# Patient Record
Sex: Male | Born: 1940 | Race: White | Hispanic: No | Marital: Married | Smoking: Former smoker
Health system: Southern US, Community
[De-identification: ages and names within clinical notes are randomized; demographics above are authoritative.]

## PROBLEM LIST (undated history)

## (undated) DIAGNOSIS — I10 Essential (primary) hypertension: Secondary | ICD-10-CM

## (undated) DIAGNOSIS — S2239XA Fracture of one rib, unspecified side, initial encounter for closed fracture: Secondary | ICD-10-CM

## (undated) HISTORY — DX: Fracture of one rib, unspecified side, initial encounter for closed fracture: S22.39XA

---

## 2010-11-09 HISTORY — PX: CHOLECYSTECTOMY: SHX55

## 2010-12-29 ENCOUNTER — Inpatient Hospital Stay: Payer: Self-pay | Admitting: Surgery

## 2012-01-21 ENCOUNTER — Ambulatory Visit: Payer: Self-pay | Admitting: Gastroenterology

## 2012-01-26 LAB — PATHOLOGY REPORT

## 2012-07-04 DIAGNOSIS — R7309 Other abnormal glucose: Secondary | ICD-10-CM | POA: Insufficient documentation

## 2012-07-04 DIAGNOSIS — I129 Hypertensive chronic kidney disease with stage 1 through stage 4 chronic kidney disease, or unspecified chronic kidney disease: Secondary | ICD-10-CM | POA: Insufficient documentation

## 2014-12-11 DIAGNOSIS — H3532 Exudative age-related macular degeneration: Secondary | ICD-10-CM | POA: Diagnosis not present

## 2014-12-11 DIAGNOSIS — H43813 Vitreous degeneration, bilateral: Secondary | ICD-10-CM | POA: Diagnosis not present

## 2014-12-11 DIAGNOSIS — H35052 Retinal neovascularization, unspecified, left eye: Secondary | ICD-10-CM | POA: Diagnosis not present

## 2014-12-11 DIAGNOSIS — H3531 Nonexudative age-related macular degeneration: Secondary | ICD-10-CM | POA: Diagnosis not present

## 2014-12-17 DIAGNOSIS — H35052 Retinal neovascularization, unspecified, left eye: Secondary | ICD-10-CM | POA: Diagnosis not present

## 2014-12-17 DIAGNOSIS — H3532 Exudative age-related macular degeneration: Secondary | ICD-10-CM | POA: Diagnosis not present

## 2015-01-03 DIAGNOSIS — Z9181 History of falling: Secondary | ICD-10-CM | POA: Diagnosis not present

## 2015-01-03 DIAGNOSIS — I10 Essential (primary) hypertension: Secondary | ICD-10-CM | POA: Diagnosis not present

## 2015-01-03 DIAGNOSIS — Z1389 Encounter for screening for other disorder: Secondary | ICD-10-CM | POA: Diagnosis not present

## 2015-01-21 DIAGNOSIS — H4011X1 Primary open-angle glaucoma, mild stage: Secondary | ICD-10-CM | POA: Diagnosis not present

## 2015-03-04 DIAGNOSIS — H3531 Nonexudative age-related macular degeneration: Secondary | ICD-10-CM | POA: Diagnosis not present

## 2015-03-04 DIAGNOSIS — H43813 Vitreous degeneration, bilateral: Secondary | ICD-10-CM | POA: Diagnosis not present

## 2015-03-04 DIAGNOSIS — H3532 Exudative age-related macular degeneration: Secondary | ICD-10-CM | POA: Diagnosis not present

## 2015-03-04 DIAGNOSIS — H35052 Retinal neovascularization, unspecified, left eye: Secondary | ICD-10-CM | POA: Diagnosis not present

## 2015-03-15 DIAGNOSIS — H35052 Retinal neovascularization, unspecified, left eye: Secondary | ICD-10-CM | POA: Diagnosis not present

## 2015-03-15 DIAGNOSIS — H3532 Exudative age-related macular degeneration: Secondary | ICD-10-CM | POA: Diagnosis not present

## 2015-03-29 DIAGNOSIS — Z008 Encounter for other general examination: Secondary | ICD-10-CM | POA: Diagnosis not present

## 2015-04-29 DIAGNOSIS — H4011X1 Primary open-angle glaucoma, mild stage: Secondary | ICD-10-CM | POA: Diagnosis not present

## 2015-05-24 DIAGNOSIS — H25813 Combined forms of age-related cataract, bilateral: Secondary | ICD-10-CM | POA: Diagnosis not present

## 2015-05-24 DIAGNOSIS — T1512XA Foreign body in conjunctival sac, left eye, initial encounter: Secondary | ICD-10-CM | POA: Diagnosis not present

## 2015-07-11 DIAGNOSIS — S2239XA Fracture of one rib, unspecified side, initial encounter for closed fracture: Secondary | ICD-10-CM

## 2015-07-11 HISTORY — DX: Fracture of one rib, unspecified side, initial encounter for closed fracture: S22.39XA

## 2015-07-17 DIAGNOSIS — H3531 Nonexudative age-related macular degeneration: Secondary | ICD-10-CM | POA: Diagnosis not present

## 2015-07-17 DIAGNOSIS — H3532 Exudative age-related macular degeneration: Secondary | ICD-10-CM | POA: Diagnosis not present

## 2015-07-26 ENCOUNTER — Emergency Department: Payer: Medicare Other

## 2015-07-26 ENCOUNTER — Observation Stay
Admission: EM | Admit: 2015-07-26 | Discharge: 2015-07-27 | Disposition: A | Discharge status: Home / Self Care | Payer: Medicare Other | Attending: Emergency Medicine | Admitting: Emergency Medicine

## 2015-07-26 ENCOUNTER — Encounter: Payer: Self-pay | Admitting: Radiology

## 2015-07-26 DIAGNOSIS — I10 Essential (primary) hypertension: Secondary | ICD-10-CM | POA: Diagnosis not present

## 2015-07-26 DIAGNOSIS — Z87891 Personal history of nicotine dependence: Secondary | ICD-10-CM | POA: Diagnosis not present

## 2015-07-26 DIAGNOSIS — Z8249 Family history of ischemic heart disease and other diseases of the circulatory system: Secondary | ICD-10-CM | POA: Insufficient documentation

## 2015-07-26 DIAGNOSIS — S2242XA Multiple fractures of ribs, left side, initial encounter for closed fracture: Secondary | ICD-10-CM | POA: Insufficient documentation

## 2015-07-26 DIAGNOSIS — S27329A Contusion of lung, unspecified, initial encounter: Secondary | ICD-10-CM | POA: Diagnosis not present

## 2015-07-26 DIAGNOSIS — Z043 Encounter for examination and observation following other accident: Secondary | ICD-10-CM | POA: Diagnosis not present

## 2015-07-26 DIAGNOSIS — S272XXD Traumatic hemopneumothorax, subsequent encounter: Secondary | ICD-10-CM

## 2015-07-26 DIAGNOSIS — I2510 Atherosclerotic heart disease of native coronary artery without angina pectoris: Secondary | ICD-10-CM | POA: Diagnosis not present

## 2015-07-26 DIAGNOSIS — S272XXA Traumatic hemopneumothorax, initial encounter: Secondary | ICD-10-CM | POA: Insufficient documentation

## 2015-07-26 DIAGNOSIS — Y9352 Activity, horseback riding: Secondary | ICD-10-CM | POA: Diagnosis not present

## 2015-07-26 DIAGNOSIS — Z79899 Other long term (current) drug therapy: Secondary | ICD-10-CM | POA: Diagnosis not present

## 2015-07-26 DIAGNOSIS — J9811 Atelectasis: Secondary | ICD-10-CM | POA: Diagnosis not present

## 2015-07-26 DIAGNOSIS — S32018A Other fracture of first lumbar vertebra, initial encounter for closed fracture: Secondary | ICD-10-CM | POA: Diagnosis not present

## 2015-07-26 DIAGNOSIS — S270XXA Traumatic pneumothorax, initial encounter: Secondary | ICD-10-CM | POA: Diagnosis not present

## 2015-07-26 DIAGNOSIS — Z9049 Acquired absence of other specified parts of digestive tract: Secondary | ICD-10-CM | POA: Insufficient documentation

## 2015-07-26 DIAGNOSIS — R911 Solitary pulmonary nodule: Secondary | ICD-10-CM | POA: Insufficient documentation

## 2015-07-26 DIAGNOSIS — R109 Unspecified abdominal pain: Secondary | ICD-10-CM | POA: Insufficient documentation

## 2015-07-26 DIAGNOSIS — S2249XA Multiple fractures of ribs, unspecified side, initial encounter for closed fracture: Secondary | ICD-10-CM | POA: Insufficient documentation

## 2015-07-26 HISTORY — DX: Essential (primary) hypertension: I10

## 2015-07-26 LAB — CBC WITH DIFFERENTIAL/PLATELET
BASOS ABS: 0 10*3/uL (ref 0–0.1)
BASOS PCT: 0 %
Eosinophils Absolute: 0.2 10*3/uL (ref 0–0.7)
Eosinophils Relative: 2 %
HCT: 37.1 % — ABNORMAL LOW (ref 40.0–52.0)
HEMOGLOBIN: 12.7 g/dL — AB (ref 13.0–18.0)
Lymphocytes Relative: 17 %
Lymphs Abs: 1.4 10*3/uL (ref 1.0–3.6)
MCH: 31.7 pg (ref 26.0–34.0)
MCHC: 34.1 g/dL (ref 32.0–36.0)
MCV: 92.9 fL (ref 80.0–100.0)
MONO ABS: 1 10*3/uL (ref 0.2–1.0)
MONOS PCT: 11 %
NEUTROS ABS: 6.1 10*3/uL (ref 1.4–6.5)
NEUTROS PCT: 70 %
Platelets: 209 10*3/uL (ref 150–440)
RBC: 4 MIL/uL — ABNORMAL LOW (ref 4.40–5.90)
RDW: 12.8 % (ref 11.5–14.5)
WBC: 8.7 10*3/uL (ref 3.8–10.6)

## 2015-07-26 LAB — URINALYSIS COMPLETE WITH MICROSCOPIC (ARMC ONLY)
BACTERIA UA: NONE SEEN
Bilirubin Urine: NEGATIVE
Glucose, UA: NEGATIVE mg/dL
Hgb urine dipstick: NEGATIVE
Ketones, ur: NEGATIVE mg/dL
NITRITE: NEGATIVE
PROTEIN: NEGATIVE mg/dL
SPECIFIC GRAVITY, URINE: 1.03 (ref 1.005–1.030)
SQUAMOUS EPITHELIAL / LPF: NONE SEEN
pH: 5 (ref 5.0–8.0)

## 2015-07-26 LAB — APTT: APTT: 30 s (ref 24–36)

## 2015-07-26 LAB — COMPREHENSIVE METABOLIC PANEL
ALBUMIN: 3.9 g/dL (ref 3.5–5.0)
ALT: 29 U/L (ref 17–63)
AST: 47 U/L — ABNORMAL HIGH (ref 15–41)
Alkaline Phosphatase: 62 U/L (ref 38–126)
Anion gap: 7 (ref 5–15)
BUN: 25 mg/dL — ABNORMAL HIGH (ref 6–20)
CALCIUM: 8.9 mg/dL (ref 8.9–10.3)
CO2: 27 mmol/L (ref 22–32)
Chloride: 105 mmol/L (ref 101–111)
Creatinine, Ser: 1.08 mg/dL (ref 0.61–1.24)
GFR calc non Af Amer: >60 mL/min (ref >60)
GLUCOSE: 128 mg/dL — AB (ref 65–99)
POTASSIUM: 3.9 mmol/L (ref 3.5–5.1)
SODIUM: 139 mmol/L (ref 135–145)
Total Bilirubin: 1 mg/dL (ref 0.3–1.2)
Total Protein: 7.2 g/dL (ref 6.5–8.1)

## 2015-07-26 LAB — PROTIME-INR
INR: 1.08
PROTHROMBIN TIME: 14.2 s (ref 11.4–15.0)

## 2015-07-26 MED ORDER — ENOXAPARIN SODIUM 40 MG/0.4ML ~~LOC~~ SOLN: 40.0000 mg | SUBCUTANEOUS | Status: DC

## 2015-07-26 MED ORDER — KETOROLAC TROMETHAMINE 15 MG/ML IJ SOLN
15.0000 mg | Freq: Four times a day (QID) | INTRAMUSCULAR | Status: AC
  Administered 2015-07-26 – 2015-07-27 (×3): 15 mg via INTRAVENOUS
  Filled 2015-07-26 (×6): qty 1

## 2015-07-26 MED ORDER — ONDANSETRON 4 MG PO TBDP: 4.0000 mg | ORAL_TABLET | Freq: Four times a day (QID) | ORAL | Status: DC | PRN

## 2015-07-26 MED ORDER — KETOROLAC TROMETHAMINE 15 MG/ML IJ SOLN
15.0000 mg | Freq: Four times a day (QID) | INTRAMUSCULAR | Status: DC | PRN
  Filled 2015-07-26: qty 1

## 2015-07-26 MED ORDER — IOHEXOL 300 MG/ML  SOLN
100.0000 mL | Freq: Once | INTRAMUSCULAR | Status: AC | PRN
  Administered 2015-07-26: 100 mL via INTRAVENOUS
  Filled 2015-07-26: qty 100

## 2015-07-26 MED ORDER — LACTATED RINGERS IV SOLN
INTRAVENOUS | Status: DC
  Administered 2015-07-26 – 2015-07-27 (×2): via INTRAVENOUS

## 2015-07-26 MED ORDER — DIPHENHYDRAMINE HCL 12.5 MG/5ML PO ELIX: 12.5000 mg | ORAL_SOLUTION | Freq: Four times a day (QID) | ORAL | Status: DC | PRN

## 2015-07-26 MED ORDER — LOSARTAN POTASSIUM 25 MG PO TABS
100.0000 mg | ORAL_TABLET | Freq: Every day | ORAL | Status: DC
  Administered 2015-07-27: 100 mg via ORAL
  Filled 2015-07-26: qty 4

## 2015-07-26 MED ORDER — INFLUENZA VAC SPLIT QUAD 0.5 ML IM SUSY: 0.5000 mL | PREFILLED_SYRINGE | INTRAMUSCULAR | Status: DC

## 2015-07-26 MED ORDER — ONDANSETRON HCL 4 MG/2ML IJ SOLN: 4.0000 mg | Freq: Four times a day (QID) | INTRAMUSCULAR | Status: DC | PRN

## 2015-07-26 MED ORDER — HYDROMORPHONE HCL 1 MG/ML IJ SOLN: 0.5000 mg | INTRAMUSCULAR | Status: DC | PRN

## 2015-07-26 MED ORDER — CYCLOBENZAPRINE HCL 10 MG PO TABS
5.0000 mg | ORAL_TABLET | Freq: Three times a day (TID) | ORAL | Status: DC
  Administered 2015-07-26 – 2015-07-27 (×3): 5 mg via ORAL
  Filled 2015-07-26 (×3): qty 1

## 2015-07-26 MED ORDER — DIPHENHYDRAMINE HCL 50 MG/ML IJ SOLN: 12.5000 mg | Freq: Four times a day (QID) | INTRAMUSCULAR | Status: DC | PRN

## 2015-07-26 MED ORDER — OXYCODONE HCL 5 MG PO TABS: 5.0000 mg | ORAL_TABLET | ORAL | Status: DC | PRN

## 2015-07-26 MED ORDER — ACETAMINOPHEN 500 MG PO TABS
1000.0000 mg | ORAL_TABLET | Freq: Four times a day (QID) | ORAL | Status: DC
  Administered 2015-07-26 – 2015-07-27 (×4): 1000 mg via ORAL
  Filled 2015-07-26 (×4): qty 2

## 2015-07-26 NOTE — ED Notes (Signed)
Pt states he was on his horse last night about 6pm and was thrown onto a post and started having swelling to his left flank in the middle of the night. States no sob, difficulty urinating or blood in the urine. Pt was having difficulty sitting down from a standing position.

## 2015-07-26 NOTE — H&P (Signed)
Jason Craig is an 74 y.o. male.   Chief Complaint: Thrown from horse into fence post HPI: 74 yr old who was thrown from horse and left back hit a fence post.  Patient states it was painful but that his back and chest began really hurting last night.  He states that it felt worse to lay down so he slept sitting up in his recliner. He denies any SOB or chest pain or any pain when he is not moving.  States chest with hurt some with a deep breath and back hurts with movement.  He denies any LOC, headache, neck pain, numbness, tingling, edema in legs, abdominal pain, dysuria or hematuria.     Past Medical History  Diagnosis Date  . Hypertension     Past Surgical History  Procedure Laterality Date  . Cholecystectomy  2012    Dr. Burt Knack    Family History  Problem Relation Age of Onset  . Heart disease Mother   . Heart disease Father    Social History:  reports that he quit smoking about 31 years ago. His smoking use included Cigarettes. He has never used smokeless tobacco. He reports that he does not drink alcohol or use illicit drugs.  Allergies: No Known Allergies   (Not in a hospital admission)  Results for orders placed or performed during the hospital encounter of 07/26/15 (from the past 48 hour(s))  Comprehensive metabolic panel     Status: Abnormal   Collection Time: 07/26/15  1:53 PM  Result Value Ref Range   Sodium 139 135 - 145 mmol/L   Potassium 3.9 3.5 - 5.1 mmol/L   Chloride 105 101 - 111 mmol/L   CO2 27 22 - 32 mmol/L   Glucose, Bld 128 (H) 65 - 99 mg/dL   BUN 25 (H) 6 - 20 mg/dL   Creatinine, Ser 1.08 0.61 - 1.24 mg/dL   Calcium 8.9 8.9 - 10.3 mg/dL   Total Protein 7.2 6.5 - 8.1 g/dL   Albumin 3.9 3.5 - 5.0 g/dL   AST 47 (H) 15 - 41 U/L   ALT 29 17 - 63 U/L   Alkaline Phosphatase 62 38 - 126 U/L   Total Bilirubin 1.0 0.3 - 1.2 mg/dL   GFR calc non Af Amer >60 >60 mL/min   GFR calc Af Amer >60 >60 mL/min    Comment: (NOTE) The eGFR has been calculated using the  CKD EPI equation. This calculation has not been validated in all clinical situations. eGFR's persistently <60 mL/min signify possible Chronic Kidney Disease.    Anion gap 7 5 - 15  CBC with Differential     Status: Abnormal   Collection Time: 07/26/15  1:53 PM  Result Value Ref Range   WBC 8.7 3.8 - 10.6 K/uL   RBC 4.00 (L) 4.40 - 5.90 MIL/uL   Hemoglobin 12.7 (L) 13.0 - 18.0 g/dL   HCT 37.1 (L) 40.0 - 52.0 %   MCV 92.9 80.0 - 100.0 fL   MCH 31.7 26.0 - 34.0 pg   MCHC 34.1 32.0 - 36.0 g/dL   RDW 12.8 11.5 - 14.5 %   Platelets 209 150 - 440 K/uL   Neutrophils Relative % 70 %   Neutro Abs 6.1 1.4 - 6.5 K/uL   Lymphocytes Relative 17 %   Lymphs Abs 1.4 1.0 - 3.6 K/uL   Monocytes Relative 11 %   Monocytes Absolute 1.0 0.2 - 1.0 K/uL   Eosinophils Relative 2 %   Eosinophils  Absolute 0.2 0 - 0.7 K/uL   Basophils Relative 0 %   Basophils Absolute 0.0 0 - 0.1 K/uL  APTT     Status: None   Collection Time: 07/26/15  1:53 PM  Result Value Ref Range   aPTT 30 24 - 36 seconds  Protime-INR     Status: None   Collection Time: 07/26/15  1:53 PM  Result Value Ref Range   Prothrombin Time 14.2 11.4 - 15.0 seconds   INR 1.08   Urinalysis complete, with microscopic     Status: Abnormal   Collection Time: 07/26/15  2:21 PM  Result Value Ref Range   Color, Urine AMBER (A) YELLOW   APPearance CLEAR (A) CLEAR   Glucose, UA NEGATIVE NEGATIVE mg/dL   Bilirubin Urine NEGATIVE NEGATIVE   Ketones, ur NEGATIVE NEGATIVE mg/dL   Specific Gravity, Urine 1.030 1.005 - 1.030   Hgb urine dipstick NEGATIVE NEGATIVE   pH 5.0 5.0 - 8.0   Protein, ur NEGATIVE NEGATIVE mg/dL   Nitrite NEGATIVE NEGATIVE   Leukocytes, UA TRACE (A) NEGATIVE   RBC / HPF 0-5 0 - 5 RBC/hpf   WBC, UA 0-5 0 - 5 WBC/hpf   Bacteria, UA NONE SEEN NONE SEEN   Squamous Epithelial / LPF NONE SEEN NONE SEEN   Mucous PRESENT    Ct Head Wo Contrast  07/26/2015   CLINICAL DATA:  Patient thrown office worse last night  EXAM: CT HEAD  WITHOUT CONTRAST  CT CERVICAL SPINE WITHOUT CONTRAST  TECHNIQUE: Multidetector CT imaging of the head and cervical spine was performed following the standard protocol without intravenous contrast. Multiplanar CT image reconstructions of the cervical spine were also generated.  COMPARISON:  None.  FINDINGS: CT HEAD FINDINGS  Normal attenuation allowing for intravenous contrast administration, which could potentially disguise small foci of hemorrhage. No extra-axial hemorrhage or parenchymal hematoma. No evidence of infarct or mass. Calvarium is intact. Moderate chronic appearing inflammatory change in the visualized portions of the maxillary and ethmoid air cells.  CT CERVICAL SPINE FINDINGS  Normal alignment. No prevertebral soft tissue swelling. No fracture. Large multilevel anterior osteophytes.  Small pneumothorax noted on the left as described on report of CT thorax performed earlier today.  IMPRESSION: No acute abnormality cervical spine. No acute intracranial abnormalities allowing for recent intravenous contrast administration.   Electronically Signed   By: Skipper Cliche M.D.   On: 07/26/2015 16:58   Ct Chest W Contrast  07/26/2015   CLINICAL DATA:  Thrown from a horse today.  Left flank pain.  EXAM: CT CHEST, ABDOMEN, AND PELVIS WITH CONTRAST  TECHNIQUE: Multidetector CT imaging of the chest, abdomen and pelvis was performed following the standard protocol during bolus administration of intravenous contrast.  CONTRAST:  143m OMNIPAQUE IOHEXOL 300 MG/ML  SOLN  COMPARISON:  None.  FINDINGS: CT CHEST FINDINGS  Chest wall: No chest wall mass or hematoma. No supraclavicular or axillary adenopathy. The thyroid gland appears normal.  There are multiple left lower rib fractures. The eighth, ninth, tenth, eleventh and twelfth left ribs are fractured. The tenth rib fracture is displaced. The tenth, eleventh and twelfth ribs are fractured in 2 places, near the spine and more posterior laterally. There are also  fractures of the first and second left transverse processes. The thoracic and lumbar vertebral bodies are normally aligned. No acute compression fracture. Advanced facet disease noted in the thoracolumbar spine.  Mediastinum: The heart is upper limits of normal in size. No pericardial effusion. No mediastinal hematoma.  No sternal fracture or substernal hematoma. The aorta is normal in caliber. No dissection. Coronary artery calcifications are noted. The esophagus is grossly normal.  Lungs/ pleura: There is a the small left-sided pneumothorax estimated at 5-10%. There is also subcutaneous emphysema involving the left lower lateral posterior chest wall. There is left lower lobe atelectasis or pulmonary contusion along with a small left pleural effusion.  CT ABDOMEN AND PELVIS FINDINGS  Hepatobiliary: No focal hepatic lesions or intrahepatic biliary dilatation. A few small calcified granulomas are noted. No acute hepatic injury. No perihepatic fluid collections. The gallbladder is surgically absent. No common bile duct dilatation.  Pancreas: No mass, inflammation or acute injury. No peripancreatic fluid collections.  Spleen: Normal size. No focal lesions. No acute injury. No periapical splenic fluid collections.  Adrenals/Urinary Tract: The adrenal glands are normal. Both kidneys are normal. No acute injury.  Stomach/Bowel: The stomach, duodenum, small bowel and colon are grossly normal without oral contrast. The terminal ileum is normal. The appendix is normal.  Vascular/Lymphatic: No mesenteric or retroperitoneal mass, adenopathy or hematoma. The aorta and branch vessels are patent. The major venous structures are patent.  Other: The bladder, prostate gland and seminal vesicles are normal. The rectum and sigmoid colon are normal. No pelvic mass or adenopathy. No hematoma. No inguinal mass or adenopathy.  Musculoskeletal: No significant bony findings involving the lumbar spine or pelvis. Moderate hip joint  degenerative changes bilaterally. The pubic symphysis and SI joints are intact degenerative changes are noted and partial fusion of the SI joints are noted.  IMPRESSION: 1. 8-12 left posterior rib fractures as discussed above. The tenth rib fracture is displaced. 2. Small, 5-10 percent, left-sided pneumothorax. 3. Left lower lobe atelectasis and/or contusion with small pleural effusion. 4. No acute intra-abdominal/pelvic injuries are identified. 5. Nondisplaced transverse process fractures of L1 and L2.   Electronically Signed   By: Marijo Sanes M.D.   On: 07/26/2015 15:57   Ct Cervical Spine Wo Contrast  07/26/2015   CLINICAL DATA:  Patient thrown office worse last night  EXAM: CT HEAD WITHOUT CONTRAST  CT CERVICAL SPINE WITHOUT CONTRAST  TECHNIQUE: Multidetector CT imaging of the head and cervical spine was performed following the standard protocol without intravenous contrast. Multiplanar CT image reconstructions of the cervical spine were also generated.  COMPARISON:  None.  FINDINGS: CT HEAD FINDINGS  Normal attenuation allowing for intravenous contrast administration, which could potentially disguise small foci of hemorrhage. No extra-axial hemorrhage or parenchymal hematoma. No evidence of infarct or mass. Calvarium is intact. Moderate chronic appearing inflammatory change in the visualized portions of the maxillary and ethmoid air cells.  CT CERVICAL SPINE FINDINGS  Normal alignment. No prevertebral soft tissue swelling. No fracture. Large multilevel anterior osteophytes.  Small pneumothorax noted on the left as described on report of CT thorax performed earlier today.  IMPRESSION: No acute abnormality cervical spine. No acute intracranial abnormalities allowing for recent intravenous contrast administration.   Electronically Signed   By: Skipper Cliche M.D.   On: 07/26/2015 16:58   Ct Abdomen Pelvis W Contrast  07/26/2015   CLINICAL DATA:  Thrown from a horse today.  Left flank pain.  EXAM: CT  CHEST, ABDOMEN, AND PELVIS WITH CONTRAST  TECHNIQUE: Multidetector CT imaging of the chest, abdomen and pelvis was performed following the standard protocol during bolus administration of intravenous contrast.  CONTRAST:  184m OMNIPAQUE IOHEXOL 300 MG/ML  SOLN  COMPARISON:  None.  FINDINGS: CT CHEST FINDINGS  Chest wall: No chest  wall mass or hematoma. No supraclavicular or axillary adenopathy. The thyroid gland appears normal.  There are multiple left lower rib fractures. The eighth, ninth, tenth, eleventh and twelfth left ribs are fractured. The tenth rib fracture is displaced. The tenth, eleventh and twelfth ribs are fractured in 2 places, near the spine and more posterior laterally. There are also fractures of the first and second left transverse processes. The thoracic and lumbar vertebral bodies are normally aligned. No acute compression fracture. Advanced facet disease noted in the thoracolumbar spine.  Mediastinum: The heart is upper limits of normal in size. No pericardial effusion. No mediastinal hematoma. No sternal fracture or substernal hematoma. The aorta is normal in caliber. No dissection. Coronary artery calcifications are noted. The esophagus is grossly normal.  Lungs/ pleura: There is a the small left-sided pneumothorax estimated at 5-10%. There is also subcutaneous emphysema involving the left lower lateral posterior chest wall. There is left lower lobe atelectasis or pulmonary contusion along with a small left pleural effusion.  CT ABDOMEN AND PELVIS FINDINGS  Hepatobiliary: No focal hepatic lesions or intrahepatic biliary dilatation. A few small calcified granulomas are noted. No acute hepatic injury. No perihepatic fluid collections. The gallbladder is surgically absent. No common bile duct dilatation.  Pancreas: No mass, inflammation or acute injury. No peripancreatic fluid collections.  Spleen: Normal size. No focal lesions. No acute injury. No periapical splenic fluid collections.   Adrenals/Urinary Tract: The adrenal glands are normal. Both kidneys are normal. No acute injury.  Stomach/Bowel: The stomach, duodenum, small bowel and colon are grossly normal without oral contrast. The terminal ileum is normal. The appendix is normal.  Vascular/Lymphatic: No mesenteric or retroperitoneal mass, adenopathy or hematoma. The aorta and branch vessels are patent. The major venous structures are patent.  Other: The bladder, prostate gland and seminal vesicles are normal. The rectum and sigmoid colon are normal. No pelvic mass or adenopathy. No hematoma. No inguinal mass or adenopathy.  Musculoskeletal: No significant bony findings involving the lumbar spine or pelvis. Moderate hip joint degenerative changes bilaterally. The pubic symphysis and SI joints are intact degenerative changes are noted and partial fusion of the SI joints are noted.  IMPRESSION: 1. 8-12 left posterior rib fractures as discussed above. The tenth rib fracture is displaced. 2. Small, 5-10 percent, left-sided pneumothorax. 3. Left lower lobe atelectasis and/or contusion with small pleural effusion. 4. No acute intra-abdominal/pelvic injuries are identified. 5. Nondisplaced transverse process fractures of L1 and L2.   Electronically Signed   By: Marijo Sanes M.D.   On: 07/26/2015 15:57    Review of Systems  Constitutional: Negative for fever, chills, weight loss and malaise/fatigue.  HENT: Negative for ear pain and sore throat.   Eyes: Negative for blurred vision and double vision.  Respiratory: Negative for cough, shortness of breath and wheezing.   Cardiovascular: Positive for chest pain. Negative for palpitations and leg swelling.  Gastrointestinal: Negative for nausea, vomiting, abdominal pain, diarrhea and constipation.  Genitourinary: Positive for flank pain. Negative for dysuria and hematuria.  Musculoskeletal: Positive for back pain. Negative for joint pain and neck pain.  Skin: Negative for itching and rash.   Neurological: Negative for dizziness, tremors, focal weakness, loss of consciousness, weakness and headaches.  Psychiatric/Behavioral: Negative for memory loss. The patient is not nervous/anxious.   All other systems reviewed and are negative.   Blood pressure 137/80, pulse 67, temperature 98 F (36.7 C), temperature source Oral, resp. rate 22, height '5\' 11"'  (1.803 m), weight 195 lb (  88.451 kg), SpO2 97 %. Physical Exam  Vitals reviewed. Constitutional: He is oriented to person, place, and time. He appears well-developed and well-nourished. No distress.  HENT:  Head: Normocephalic and atraumatic.  Right Ear: External ear normal.  Left Ear: External ear normal.  Nose: Nose normal.  Mouth/Throat: Oropharynx is clear and moist. No oropharyngeal exudate.  Eyes: Conjunctivae and EOM are normal. Pupils are equal, round, and reactive to light. Right eye exhibits no discharge. Left eye exhibits no discharge. No scleral icterus.  Neck: Normal range of motion. Neck supple. No tracheal deviation present. No thyromegaly present.  No pain along cervical spine, no pain in spine with ROM, musculoskeletal pain along lateral border of right SCM with palpation, no bruising, erythema or edema  Cardiovascular: Normal rate, regular rhythm, normal heart sounds and intact distal pulses.  Exam reveals no gallop and no friction rub.   No murmur heard. Respiratory: Effort normal. No respiratory distress. He has no wheezes. He has no rales. He exhibits tenderness.  Crackles in base of left lung, pain along posterior element of ribs 8-12, no pain in anterior portion  GI: Soft. Bowel sounds are normal. He exhibits no distension. There is no tenderness. There is no guarding.  Musculoskeletal: He exhibits tenderness. He exhibits no edema.  Cervical, Thoracic and Lumbar spine: no pain, tenderness stepoff or deformities, erythema, edema to left side of L1-3 No CVA tenderness bilaterally Left arm: bruising and abrasions  along left forearm but normal ROM in sholder, elbow, wrist and hand 5+ strength, no neurological defects, no pain or tenderness along joints.  All other extremities normal ROM 5+ strength, no numbness  Lymphadenopathy:    He has no cervical adenopathy.  Neurological: He is alert and oriented to person, place, and time. No cranial nerve deficit.  Skin: Skin is warm and dry. No rash noted. There is erythema.  Psychiatric: He has a normal mood and affect. His behavior is normal. Judgment and thought content normal.     Assessment/Plan 74 yr old s/p fall from horse.  I personally reviewed his past medical history and outside records of hypertension and one blood pressure medication.  I personally reviewed the images from CT scans and xrays and noted Multiple concurrent closed posterior rib fractures on the left, small left pneumothorax with small amount of hemothorax and atelectatics; Small stranding around the left kidney in the retroperitoneum, but no injury to the kidney itself, no evidence of injury to the liver, spleen, pancreas, but CT head/neck negative for any traumatic injury.  I also reviewed the radiology reports to confirm injuries seen on CT scan.  I personally reviewed laboratory data including the CBC, CMP and UA, where there is no evidence of injury to kidney or blood in urine.    Will admit to floor for aggressive pulmonary toilet, ambulation, and pain control with IV dilaudid and toradol as well as po pain medication and flexeril for muscle spasms.  Will place on 2L [place] supplemental oxygen to help re-expand lung, but given <5% pneumothorax and no acute respiratory distress, will not place chest tube at this time.  Will continue to evaluate for changes and patient/wife aware that if this changes he may need a chest tube.     Lavona Mound Loflin 07/26/2015, 5:07 PM

## 2015-07-26 NOTE — ED Notes (Signed)
Pt reports being thrown off a horse yesterday evening. His back hit a large wooden fence post. Complaining of left sided flank pain and swelling. 5/10. No LOC. No N/V/D noted.

## 2015-07-26 NOTE — ED Provider Notes (Signed)
-----------------------------------------   3:56 PM on 07/26/2015 -----------------------------------------  Patient care assumed from the physician assistant. I have seen and evaluated the patient myself. The patient has significant left lateral rib tenderness, with crepitus, and palpable rib deformity. Per the patient he was riding a horse around 6:30 PM last night when he fell off landing on a wooden fence post. Patient denies any shortness of breath but does state pain with deep inspiration. Patient was seen and examined by the physician assistant, and sent to the main side of the emergency department given findings on CT scan of a 10% pneumothorax, multiple rib fractures, and L1/L2 transverse process fractures. Patient does have mild to moderate lumbar tenderness to palpation. No deformities palpated. Overall the patient appears quite well. Does not wish for any pain medication at this time. We will obtain a CT head and cervical spine. The patient does have mild right paraspinal tenderness palpation of the neck, and given the distracting injury we will obtain imaging to rule out fracture. Patient denies any headache. Denies any loss of consciousness. Denies any vomiting. Patient's only medical history is blood pressure which he takes losartan. No blood thinners. No known allergies.  Discussed with Dr. Pat Patrick, he will see the patient to decide upon admission versus transfer. Patient appears very stable especially as the injury occurred 21 hours ago. Dr. Pat Patrick has admitted the patient. CT head and neck are negative.  Harvest Dark, MD 07/26/15 [phone]

## 2015-07-26 NOTE — ED Provider Notes (Signed)
[facility] Emergency Department Provider Note  ____________________________________________  Time seen: Approximately 1:54 PM  I have reviewed the triage vital signs and the nursing notes.   HISTORY  Chief Complaint Fall    HPI Jason Craig is a 74 y.o. male who presents to the emergency room after suffering a fall from horse last night. He states that he was thrown from his horse and struck a post in his flank area. He is having significant pain in the flank region and is now experiencing swelling from the flank into the right inguinal area. He states there is no dysuria, difficulty urinating, blood in his urine, change in frequency or melena of urine. He denies any shortness of breath or chest pain. He denies striking his head or neck. He denies any headache or neck pain. The pain is described as moderate to severe. The pain is described as sharp and pressure.   Past Medical History  Diagnosis Date  . Hypertension     Patient Active Problem List   Diagnosis Date Noted  . Rib fractures 07/26/2015  . Pneumothorax with hemothorax, traumatic   . Fracture of multiple ribs   . BP (high blood pressure) 07/04/2012  . Blood glucose elevated 07/04/2012    Past Surgical History  Procedure Laterality Date  . Cholecystectomy  2012    Dr. Burt Knack    Current Outpatient Rx  Name  Route  Sig  Dispense  Refill  . Ascorbic Acid (VITAMIN C PO)   Oral   Take 1 tablet by mouth daily.         . Calcium Citrate-Vitamin D (CALCIUM + D PO)   Oral   Take 1 tablet by mouth daily.         . Coenzyme Q10 (COQ10 PO)   Oral   Take 1 capsule by mouth daily.         Marland Kitchen losartan (COZAAR) 100 MG tablet   Oral   Take 100 mg by mouth daily.         . Multiple Vitamin (MULTIVITAMIN WITH MINERALS) TABS tablet   Oral   Take 1 tablet by mouth daily.         . Omega-3 Fatty Acids (FISH OIL PO)   Oral   Take 1 capsule by mouth daily.         Marland Kitchen oxyCODONE (OXY  IR/ROXICODONE) 5 MG immediate release tablet   Oral   Take 1-2 tablets (5-10 mg total) by mouth every 4 (four) hours as needed for moderate pain.   30 tablet   0     Allergies Review of patient's allergies indicates no known allergies.  Family History  Problem Relation Age of Onset  . Heart disease Mother   . Heart disease Father     Social History Social History  Substance Use Topics  . Smoking status: Former Smoker    Types: Cigarettes    Quit date: 11/25/1983  . Smokeless tobacco: Never Used  . Alcohol Use: No    Review of Systems Constitutional: No fever/chills Eyes: No visual changes. ENT: No sore throat. Cardiovascular: Denies chest pain. Respiratory: Denies shortness of breath. Gastrointestinal: Positive for left-sided abdominal pain.  No nausea, no vomiting.  No diarrhea.  No constipation. Genitourinary: Negative for dysuria. Negative for blood. Negative for any type of urinary change. Musculoskeletal: Positive for back pain. Skin: Negative for rash. Neurological: Negative for headaches, focal weakness or numbness.  10-point ROS otherwise negative.  ____________________________________________  PHYSICAL EXAM:  VITAL SIGNS: ED Triage Vitals  Enc Vitals Group     BP 07/26/15 1319 140/68 mmHg     Pulse Rate 07/26/15 1319 87     Resp 07/26/15 1319 18     Temp 07/26/15 1319 98 F (36.7 C)     Temp Source 07/26/15 1319 Oral     SpO2 07/26/15 1319 96 %     Weight 07/26/15 1319 195 lb (88.451 kg)     Height 07/26/15 1319 5\' 11"  (1.803 m)     Head Cir --      Peak Flow --      Pain Score 07/26/15 1328 5     Pain Loc --      Pain Edu? --      Excl. in [place]? --     Constitutional: Alert and oriented. Well appearing and in no acute distress. Eyes: Conjunctivae are normal. PERRL. EOMI. Head: Atraumatic. Nose: No congestion/rhinnorhea. Mouth/Throat: Mucous membranes are moist.  Oropharynx non-erythematous. Neck: No stridor.  No cervical spine tenderness  to palpation. Cardiovascular: Normal rate, regular rhythm. Grossly normal heart sounds.  Good peripheral circulation. Respiratory: Normal respiratory effort.  No retractions. Lungs CTAB. breath sounds Gastrointestinal: Soft and nontender. Mild SI distention. No abdominal bruits. Positive CVA tenderness to left side negative CVA tenderness on right side. Musculoskeletal: No lower extremity tenderness nor edema.  No joint effusions. Diffuse tenderness to left ribs, left flank, and left abdomen. No point tenderness along spine. No flail chest segments noted.  Neurologic:  Normal speech and language. No gross focal neurologic deficits are appreciated. No gait instability. Skin:  Skin is warm, dry and intact. No rash noted. Psychiatric: Mood and affect are normal. Speech and behavior are normal.  ____________________________________________   LABS (all labs ordered are listed, but only abnormal results are displayed)  Labs Reviewed  COMPREHENSIVE METABOLIC PANEL - Abnormal; Notable for the following:    Glucose, Bld 128 (*)    BUN 25 (*)    AST 47 (*)    All other components within normal limits  CBC WITH DIFFERENTIAL/PLATELET - Abnormal; Notable for the following:    RBC 4.00 (*)    Hemoglobin 12.7 (*)    HCT 37.1 (*)    All other components within normal limits  URINALYSIS COMPLETEWITH MICROSCOPIC ([facility] ONLY) - Abnormal; Notable for the following:    Color, Urine AMBER (*)    APPearance CLEAR (*)    Leukocytes, UA TRACE (*)    All other components within normal limits  BASIC METABOLIC PANEL - Abnormal; Notable for the following:    Glucose, Bld 108 (*)    BUN 23 (*)    Calcium 8.4 (*)    All other components within normal limits  APTT  PROTIME-INR     Labs reviewed. Urinalysis came back negative for any signs of blood. Incidental finding of trace leuks. CMP returns relatively normal. CBC abnormal for  anemia. ____________________________________________  EKG  None ____________________________________________  RADIOLOGY  Took report from radiologist over the phone. Radiologist reports 8 through 12 left-sided rib fractures. The 10th rib is displaced. Small pneumothorax of 10% on the side. L1 and L2 fracture. Small amounts of subcutaneous air. Report to follow. ____________________________________________   PROCEDURES  Procedure(s) performed: None    ____________________________________________   INITIAL IMPRESSION / ASSESSMENT AND PLAN / ED COURSE  Pertinent labs & imaging results that were available during my care of the patient were reviewed by me and considered in my medical  decision making (see chart for details).  Patient is a 74 year old male came in status post a fall off a horse striking a fence post. Laboratory results were positive for mild anemia and some other incidental findings as dictated above. Extensive radiologic report. Discussed findings with Dr. Silverio Lay and Dr. Burlene Arnt. He is being transferred to room on A pod. ____________________________________________   FINAL CLINICAL IMPRESSION(S) / ED DIAGNOSES  Final diagnoses:  Pneumothorax with hemothorax, traumatic, subsequent encounter      Darletta Moll, PA-C 08/20/15 [place], MD 08/21/15 [phone]

## 2015-07-27 ENCOUNTER — Observation Stay: Payer: Medicare Other

## 2015-07-27 DIAGNOSIS — S271XXA Traumatic hemothorax, initial encounter: Secondary | ICD-10-CM | POA: Diagnosis not present

## 2015-07-27 DIAGNOSIS — S2232XD Fracture of one rib, left side, subsequent encounter for fracture with routine healing: Secondary | ICD-10-CM | POA: Diagnosis not present

## 2015-07-27 LAB — BASIC METABOLIC PANEL
Anion gap: 6 (ref 5–15)
BUN: 23 mg/dL — AB (ref 6–20)
CHLORIDE: 105 mmol/L (ref 101–111)
CO2: 28 mmol/L (ref 22–32)
Calcium: 8.4 mg/dL — ABNORMAL LOW (ref 8.9–10.3)
Creatinine, Ser: 1.14 mg/dL (ref 0.61–1.24)
GFR calc Af Amer: >60 mL/min (ref >60)
GLUCOSE: 108 mg/dL — AB (ref 65–99)
POTASSIUM: 3.8 mmol/L (ref 3.5–5.1)
Sodium: 139 mmol/L (ref 135–145)

## 2015-07-27 MED ORDER — OXYCODONE HCL 5 MG PO TABS: 5.0000 mg | ORAL_TABLET | ORAL | Status: DC | PRN

## 2015-07-27 NOTE — Discharge Summary (Signed)
Patient ID: Jason Craig MRN: [id number] DOB/AGE: 1941-08-18 74 y.o.  Admit date: 07/26/2015 Discharge date: 07/27/2015  Discharge Diagnoses:  Multiple close rib fractures left, pneumothorax left, multiple transverse process fractures  Procedures Performed: None  Discharged Condition: fair  Hospital Course: He was admitted to the emergency room after a fall 24 hours earlier from a horse. Workup with multiple CT scans demonstrated lateral rib fractures 8 through 12 with a very tiny pneumothorax and small pleural effusion. He did not have any significant GI findings. He does not have any abdominal symptoms. He also had several transverse process fractures and his spine. He was admitted the hospital placed on pain medication and IV rehydration observed over the course of the evening. He did not develop any further problems. Chest x-ray this morning reveal slightly more fluid in the base of the left chest but no further pneumothorax. He is discharged home this evening and followup in the office as necessary. Caution regarding activity bathing and driving or given the patient. Seen back in the office as necessary.  Discharge Orders: Discharge Instructions    Diet - low sodium heart healthy    Complete by:  As directed      Increase activity slowly    Complete by:  As directed            Disposition:   Discharge Medications:  Current facility-administered medications:  .  acetaminophen (TYLENOL) tablet 1,000 mg, 1,000 mg, Oral, 4 times per day, Hubbard Robinson, MD, 1,000 mg at 07/27/15 1314 .  cyclobenzaprine (FLEXERIL) tablet 5 mg, 5 mg, Oral, TID, Hubbard Robinson, MD, 5 mg at 07/27/15 0947 .  diphenhydrAMINE (BENADRYL) 12.5 MG/5ML elixir 12.5 mg, 12.5 mg, Oral, Q6H PRN **OR** diphenhydrAMINE (BENADRYL) injection 12.5 mg, 12.5 mg, Intravenous, Q6H PRN, Hubbard Robinson, MD .  HYDROmorphone (DILAUDID) injection 0.5 mg, 0.5 mg, Intravenous, Q3H PRN, Hubbard Robinson, MD .   Influenza vac split quadrivalent PF (FLUARIX) injection 0.5 mL, 0.5 mL, Intramuscular, Tomorrow-1000, Hubbard Robinson, MD, 0.5 mL at 07/27/15 1000 .  [COMPLETED] ketorolac (TORADOL) 15 MG/ML injection 15 mg, 15 mg, Intravenous, 4 times per day, 15 mg at 07/27/15 0541 **FOLLOWED BY** ketorolac (TORADOL) 15 MG/ML injection 15 mg, 15 mg, Intravenous, Q6H PRN, Hubbard Robinson, MD .  lactated ringers infusion, , Intravenous, Continuous, Hubbard Robinson, MD, Last Rate: 75 mL/hr at 07/27/15 0541 .  losartan (COZAAR) tablet 100 mg, 100 mg, Oral, Daily, Hubbard Robinson, MD, 100 mg at 07/27/15 0947 .  ondansetron (ZOFRAN-ODT) disintegrating tablet 4 mg, 4 mg, Oral, Q6H PRN **OR** ondansetron (ZOFRAN) injection 4 mg, 4 mg, Intravenous, Q6H PRN, Hubbard Robinson, MD .  oxyCODONE (Oxy IR/ROXICODONE) immediate release tablet 5-10 mg, 5-10 mg, Oral, Q4H PRN, Hubbard Robinson, MD  Follwup: Follow-up Information    Follow up with [facility].   Why:  As needed   Contact information:   [place] [id number] [phone]      Signed: Dia Crawford III 07/27/2015, 4:27 PM

## 2015-07-27 NOTE — Progress Notes (Signed)
Pt A and O x 4. VSS. Pt tolerating diet well. No complaints of pain or nausea. IV removed intact, prescriptions given. Pt voiced understanding of discharge instructions with no further questions. Pt discharged via wheelchair with nurse.   

## 2015-07-27 NOTE — Progress Notes (Signed)
Subjective:  he is feeling fairly well this morning. His pain is under reasonable control. He is breathing easily and has no GI complaints.  Vital signs in last 24 hours: Temp:  [97.7 F (36.5 C)-98.2 F (36.8 C)] 98 F (36.7 C) (09/17 0459) Pulse Rate:  [63-87] 66 (09/17 0459) Resp:  [17-26] 17 (09/17 0459) BP: (124-140)/(60-80) 124/60 mmHg (09/17 0459) SpO2:  [95 %-100 %] 96 % (09/17 0459) Weight:  [88.451 kg (195 lb)] 88.451 kg (195 lb) (09/16 1319) Last BM Date: 07/25/15  Intake/Output from previous day: 09/16 0701 - 09/17 0700 In: 643 [I.V.:643] Out: 400 [Urine:400]  GI: his abdomen is soft nontender with no organomegaly no rebound or guarding. His lungs are clear. He does not appear to have any significant lateral chest wall tenderness.  Lab Results:  CBC  Recent Labs  07/26/15 1353  WBC 8.7  HGB 12.7*  HCT 37.1*  PLT 209   CMP     Component Value Date/Time   NA 139 07/27/2015 0536   K 3.8 07/27/2015 0536   CL 105 07/27/2015 0536   CO2 28 07/27/2015 0536   GLUCOSE 108* 07/27/2015 0536   BUN 23* 07/27/2015 0536   CREATININE 1.14 07/27/2015 0536   CALCIUM 8.4* 07/27/2015 0536   PROT 7.2 07/26/2015 1353   ALBUMIN 3.9 07/26/2015 1353   AST 47* 07/26/2015 1353   ALT 29 07/26/2015 1353   ALKPHOS 62 07/26/2015 1353   BILITOT 1.0 07/26/2015 1353   GFRNONAA >60 07/27/2015 0536   GFRAA >60 07/27/2015 0536   PT/INR  Recent Labs  07/26/15 1353  LABPROT 14.2  INR 1.08    Studies/Results: Ct Head Wo Contrast  07/26/2015   CLINICAL DATA:  Patient thrown office worse last night  EXAM: CT HEAD WITHOUT CONTRAST  CT CERVICAL SPINE WITHOUT CONTRAST  TECHNIQUE: Multidetector CT imaging of the head and cervical spine was performed following the standard protocol without intravenous contrast. Multiplanar CT image reconstructions of the cervical spine were also generated.  COMPARISON:  None.  FINDINGS: CT HEAD FINDINGS  Normal attenuation allowing for intravenous  contrast administration, which could potentially disguise small foci of hemorrhage. No extra-axial hemorrhage or parenchymal hematoma. No evidence of infarct or mass. Calvarium is intact. Moderate chronic appearing inflammatory change in the visualized portions of the maxillary and ethmoid air cells.  CT CERVICAL SPINE FINDINGS  Normal alignment. No prevertebral soft tissue swelling. No fracture. Large multilevel anterior osteophytes.  Small pneumothorax noted on the left as described on report of CT thorax performed earlier today.  IMPRESSION: No acute abnormality cervical spine. No acute intracranial abnormalities allowing for recent intravenous contrast administration.   Electronically Signed   By: Skipper Cliche M.D.   On: 07/26/2015 16:58   Ct Chest W Contrast  07/26/2015   ADDENDUM REPORT: 07/26/2015 17:18 ADDENDUM: I marked and measured a right middle lobe lung nodule but failed to mention in the report. There is a 5 mm right middle lobe subpleural pulmonary nodule on image number 43. If the patient is at high risk for bronchogenic carcinoma, follow-up chest CT at 6-12 months is recommended. If the patient is at low risk for bronchogenic carcinoma, follow-up chest CT at 12 months is recommended. This recommendation follows the consensus statement: Guidelines for Management of Small Pulmonary Nodules Detected on CT Scans: A Statement from the [place] as published in Radiology 2005;237:395-400. Electronically Signed   By: Marijo Sanes M.D.   On: 07/26/2015 17:18  07/26/2015  CLINICAL DATA:  Thrown from a horse today.  Left flank pain.  EXAM: CT CHEST, ABDOMEN, AND PELVIS WITH CONTRAST  TECHNIQUE: Multidetector CT imaging of the chest, abdomen and pelvis was performed following the standard protocol during bolus administration of intravenous contrast.  CONTRAST:  136mL OMNIPAQUE IOHEXOL 300 MG/ML  SOLN  COMPARISON:  None.  FINDINGS: CT CHEST FINDINGS  Chest wall: No chest wall mass or hematoma.  No supraclavicular or axillary adenopathy. The thyroid gland appears normal.  There are multiple left lower rib fractures. The eighth, ninth, tenth, eleventh and twelfth left ribs are fractured. The tenth rib fracture is displaced. The tenth, eleventh and twelfth ribs are fractured in 2 places, near the spine and more posterior laterally. There are also fractures of the first and second left transverse processes. The thoracic and lumbar vertebral bodies are normally aligned. No acute compression fracture. Advanced facet disease noted in the thoracolumbar spine.  Mediastinum: The heart is upper limits of normal in size. No pericardial effusion. No mediastinal hematoma. No sternal fracture or substernal hematoma. The aorta is normal in caliber. No dissection. Coronary artery calcifications are noted. The esophagus is grossly normal.  Lungs/ pleura: There is a the small left-sided pneumothorax estimated at 5-10%. There is also subcutaneous emphysema involving the left lower lateral posterior chest wall. There is left lower lobe atelectasis or pulmonary contusion along with a small left pleural effusion.  CT ABDOMEN AND PELVIS FINDINGS  Hepatobiliary: No focal hepatic lesions or intrahepatic biliary dilatation. A few small calcified granulomas are noted. No acute hepatic injury. No perihepatic fluid collections. The gallbladder is surgically absent. No common bile duct dilatation.  Pancreas: No mass, inflammation or acute injury. No peripancreatic fluid collections.  Spleen: Normal size. No focal lesions. No acute injury. No periapical splenic fluid collections.  Adrenals/Urinary Tract: The adrenal glands are normal. Both kidneys are normal. No acute injury.  Stomach/Bowel: The stomach, duodenum, small bowel and colon are grossly normal without oral contrast. The terminal ileum is normal. The appendix is normal.  Vascular/Lymphatic: No mesenteric or retroperitoneal mass, adenopathy or hematoma. The aorta and branch  vessels are patent. The major venous structures are patent.  Other: The bladder, prostate gland and seminal vesicles are normal. The rectum and sigmoid colon are normal. No pelvic mass or adenopathy. No hematoma. No inguinal mass or adenopathy.  Musculoskeletal: No significant bony findings involving the lumbar spine or pelvis. Moderate hip joint degenerative changes bilaterally. The pubic symphysis and SI joints are intact degenerative changes are noted and partial fusion of the SI joints are noted.  IMPRESSION: 1. 8-12 left posterior rib fractures as discussed above. The tenth rib fracture is displaced. 2. Small, 5-10 percent, left-sided pneumothorax. 3. Left lower lobe atelectasis and/or contusion with small pleural effusion. 4. No acute intra-abdominal/pelvic injuries are identified. 5. Nondisplaced transverse process fractures of L1 and L2.  Electronically Signed: By: Marijo Sanes M.D. On: 07/26/2015 15:57   Ct Cervical Spine Wo Contrast  07/26/2015   CLINICAL DATA:  Patient thrown office worse last night  EXAM: CT HEAD WITHOUT CONTRAST  CT CERVICAL SPINE WITHOUT CONTRAST  TECHNIQUE: Multidetector CT imaging of the head and cervical spine was performed following the standard protocol without intravenous contrast. Multiplanar CT image reconstructions of the cervical spine were also generated.  COMPARISON:  None.  FINDINGS: CT HEAD FINDINGS  Normal attenuation allowing for intravenous contrast administration, which could potentially disguise small foci of hemorrhage. No extra-axial hemorrhage or parenchymal hematoma. No evidence of  infarct or mass. Calvarium is intact. Moderate chronic appearing inflammatory change in the visualized portions of the maxillary and ethmoid air cells.  CT CERVICAL SPINE FINDINGS  Normal alignment. No prevertebral soft tissue swelling. No fracture. Large multilevel anterior osteophytes.  Small pneumothorax noted on the left as described on report of CT thorax performed earlier  today.  IMPRESSION: No acute abnormality cervical spine. No acute intracranial abnormalities allowing for recent intravenous contrast administration.   Electronically Signed   By: Skipper Cliche M.D.   On: 07/26/2015 16:58   Ct Abdomen Pelvis W Contrast  07/26/2015   ADDENDUM REPORT: 07/26/2015 17:18 ADDENDUM: I marked and measured a right middle lobe lung nodule but failed to mention in the report. There is a 5 mm right middle lobe subpleural pulmonary nodule on image number 43. If the patient is at high risk for bronchogenic carcinoma, follow-up chest CT at 6-12 months is recommended. If the patient is at low risk for bronchogenic carcinoma, follow-up chest CT at 12 months is recommended. This recommendation follows the consensus statement: Guidelines for Management of Small Pulmonary Nodules Detected on CT Scans: A Statement from the [place] as published in Radiology 2005;237:395-400. Electronically Signed   By: Marijo Sanes M.D.   On: 07/26/2015 17:18  07/26/2015   CLINICAL DATA:  Thrown from a horse today.  Left flank pain.  EXAM: CT CHEST, ABDOMEN, AND PELVIS WITH CONTRAST  TECHNIQUE: Multidetector CT imaging of the chest, abdomen and pelvis was performed following the standard protocol during bolus administration of intravenous contrast.  CONTRAST:  161mL OMNIPAQUE IOHEXOL 300 MG/ML  SOLN  COMPARISON:  None.  FINDINGS: CT CHEST FINDINGS  Chest wall: No chest wall mass or hematoma. No supraclavicular or axillary adenopathy. The thyroid gland appears normal.  There are multiple left lower rib fractures. The eighth, ninth, tenth, eleventh and twelfth left ribs are fractured. The tenth rib fracture is displaced. The tenth, eleventh and twelfth ribs are fractured in 2 places, near the spine and more posterior laterally. There are also fractures of the first and second left transverse processes. The thoracic and lumbar vertebral bodies are normally aligned. No acute compression fracture. Advanced  facet disease noted in the thoracolumbar spine.  Mediastinum: The heart is upper limits of normal in size. No pericardial effusion. No mediastinal hematoma. No sternal fracture or substernal hematoma. The aorta is normal in caliber. No dissection. Coronary artery calcifications are noted. The esophagus is grossly normal.  Lungs/ pleura: There is a the small left-sided pneumothorax estimated at 5-10%. There is also subcutaneous emphysema involving the left lower lateral posterior chest wall. There is left lower lobe atelectasis or pulmonary contusion along with a small left pleural effusion.  CT ABDOMEN AND PELVIS FINDINGS  Hepatobiliary: No focal hepatic lesions or intrahepatic biliary dilatation. A few small calcified granulomas are noted. No acute hepatic injury. No perihepatic fluid collections. The gallbladder is surgically absent. No common bile duct dilatation.  Pancreas: No mass, inflammation or acute injury. No peripancreatic fluid collections.  Spleen: Normal size. No focal lesions. No acute injury. No periapical splenic fluid collections.  Adrenals/Urinary Tract: The adrenal glands are normal. Both kidneys are normal. No acute injury.  Stomach/Bowel: The stomach, duodenum, small bowel and colon are grossly normal without oral contrast. The terminal ileum is normal. The appendix is normal.  Vascular/Lymphatic: No mesenteric or retroperitoneal mass, adenopathy or hematoma. The aorta and branch vessels are patent. The major venous structures are patent.  Other: The bladder, prostate gland  and seminal vesicles are normal. The rectum and sigmoid colon are normal. No pelvic mass or adenopathy. No hematoma. No inguinal mass or adenopathy.  Musculoskeletal: No significant bony findings involving the lumbar spine or pelvis. Moderate hip joint degenerative changes bilaterally. The pubic symphysis and SI joints are intact degenerative changes are noted and partial fusion of the SI joints are noted.  IMPRESSION: 1.  8-12 left posterior rib fractures as discussed above. The tenth rib fracture is displaced. 2. Small, 5-10 percent, left-sided pneumothorax. 3. Left lower lobe atelectasis and/or contusion with small pleural effusion. 4. No acute intra-abdominal/pelvic injuries are identified. 5. Nondisplaced transverse process fractures of L1 and L2.  Electronically Signed: By: Marijo Sanes M.D. On: 07/26/2015 15:57    Assessment/Plan: He seems to be doing quite well. His pain is under reasonable control with IV medications and we will switch him to oral medications today. We will recheck his x-ray to be certain hasn't developed significant pneumothorax. He may be able to be discharged later today.

## 2015-07-29 DIAGNOSIS — H4011X1 Primary open-angle glaucoma, mild stage: Secondary | ICD-10-CM | POA: Diagnosis not present

## 2015-08-01 ENCOUNTER — Telehealth: Payer: Self-pay

## 2015-08-01 NOTE — Telephone Encounter (Signed)
Patient called with concerns that he is still bruised from his broken ribs. Reviewed chart from admission. Pt denies shortness or breath, new bruising, and any new trauma to the area. States that his bruising is only subsiding a very little bit each day. Explained to patient that he may use some Ibuprofen if needed and this should help significantly with inflammation and pain. Also informed him that he may use an ice pack to this area 4-5 times daily for 15-20 minutes each time. Verbalizes understanding and will call back with any further questions or concerns.

## 2015-08-22 ENCOUNTER — Ambulatory Visit (INDEPENDENT_AMBULATORY_CARE_PROVIDER_SITE_OTHER): Payer: Medicare Other | Admitting: Family Medicine

## 2015-08-22 ENCOUNTER — Ambulatory Visit
Admission: RE | Admit: 2015-08-22 | Discharge: 2015-08-22 | Disposition: A | Discharge status: Home / Self Care | Payer: Medicare Other | Source: Intra-hospital | Attending: Family Medicine | Admitting: Family Medicine

## 2015-08-22 ENCOUNTER — Ambulatory Visit
Admission: RE | Admit: 2015-08-22 | Discharge: 2015-08-22 | Disposition: A | Discharge status: Home / Self Care | Payer: Medicare Other | Source: Ambulatory Visit | Attending: Family Medicine | Admitting: Family Medicine

## 2015-08-22 ENCOUNTER — Encounter: Payer: Self-pay | Admitting: Family Medicine

## 2015-08-22 ENCOUNTER — Telehealth: Payer: Self-pay | Admitting: Family Medicine

## 2015-08-22 VITALS — BP 135/70 | HR 62 | Resp 16 | Ht 71.0 in | Wt 199.0 lb

## 2015-08-22 DIAGNOSIS — X58XXXD Exposure to other specified factors, subsequent encounter: Secondary | ICD-10-CM | POA: Insufficient documentation

## 2015-08-22 DIAGNOSIS — I10 Essential (primary) hypertension: Secondary | ICD-10-CM | POA: Diagnosis not present

## 2015-08-22 DIAGNOSIS — S2232XD Fracture of one rib, left side, subsequent encounter for fracture with routine healing: Secondary | ICD-10-CM | POA: Diagnosis not present

## 2015-08-22 DIAGNOSIS — S272XXD Traumatic hemopneumothorax, subsequent encounter: Secondary | ICD-10-CM | POA: Diagnosis not present

## 2015-08-22 DIAGNOSIS — Z23 Encounter for immunization: Secondary | ICD-10-CM

## 2015-08-22 DIAGNOSIS — J939 Pneumothorax, unspecified: Secondary | ICD-10-CM | POA: Diagnosis not present

## 2015-08-22 MED ORDER — LOSARTAN POTASSIUM 100 MG PO TABS: 100.0000 mg | ORAL_TABLET | Freq: Every day | ORAL | Status: DC

## 2015-08-22 NOTE — Progress Notes (Signed)
Name: Jason Craig   MRN: [id number]    DOB: 07/20/1941   Date:08/22/2015       Progress Note  Subjective  Chief Complaint  Chief Complaint  Patient presents with  . Hypertension    HPI Here for f/u of HBP.  Was feeling fine until he fell from horse and had fracture of multiple L ribs and fractured vertebrae.  Had pneumothorax (10 %).    Taking his BP med.  No problem-specific assessment & plan notes found for this encounter.   Past Medical History  Diagnosis Date  . Hypertension   . Rib fracture 07/11/2015    Horse Accident   . 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 Smoking status: Former Smoker    Types: Cigarettes    Quit date: 11/25/1983  . Smokeless tobacco: Never Used  . Alcohol Use: No     Current outpatient prescriptions:  .  Ascorbic Acid (VITAMIN C PO), Take 1 tablet by mouth daily., Disp: , Rfl:  .  Calcium Citrate-Vitamin D (CALCIUM + D PO), Take 1 tablet by mouth daily., Disp: , Rfl:  .  Coenzyme Q10 (COQ10 PO), Take 1 capsule by mouth daily., Disp: , Rfl:  .  latanoprost (XALATAN) 0.005 % ophthalmic solution, Place 1 drop into both eyes at bedtime., Disp: , Rfl: 0 .  losartan (COZAAR) 100 MG tablet, Take 100 mg by mouth daily., Disp: , Rfl:  .  Multiple Vitamin (MULTIVITAMIN WITH MINERALS) TABS tablet, Take 1 tablet by mouth daily., Disp: , Rfl:  .  Omega-3 Fatty Acids (FISH OIL PO), Take 1 capsule by mouth daily., Disp: , Rfl:  .  oxyCODONE (OXY IR/ROXICODONE) 5 MG immediate release tablet, Take 1-2 tablets (5-10 mg total) by mouth every 4 (four) hours as needed for moderate pain. (Patient not taking: Reported on 08/22/2015), Disp: 30 tablet, Rfl: 0  No Known Allergies  Review of Systems  Constitutional: Negative for fever, chills, weight loss and malaise/fatigue.  HENT: Negative for hearing loss.   Eyes: Negative for blurred vision and double vision.  Respiratory:  Negative for cough, sputum production, shortness of breath and wheezing.   Cardiovascular: Positive for chest pain (chest wall pain sec. to fracture). Negative for palpitations, orthopnea and leg swelling.  Gastrointestinal: Negative for heartburn, abdominal pain and blood in stool.  Genitourinary: Negative for dysuria, urgency and frequency.  Neurological: Positive for dizziness, sensory change and focal weakness. Negative for weakness and headaches.      Objective  Filed Vitals:   08/22/15 0905  BP: 142/75  Pulse: 62  Resp: 16  Height: '5\' 11"'  (1.803 m)  Weight: 199 lb (90.266 kg)     Physical Exam  Constitutional: He is well-developed, well-nourished, and in no distress. No distress.  HENT:  Head: Normocephalic and atraumatic.  Eyes: Conjunctivae and EOM are normal. Pupils are equal, round, and reactive to light. No scleral icterus.  Neck: Normal range of motion. Neck supple. Carotid bruit is not present. No thyromegaly present.  Cardiovascular: Normal rate, regular rhythm, normal heart sounds and intact distal pulses.  Exam reveals no gallop and no friction rub.   No murmur heard. Pulmonary/Chest: Effort normal and breath sounds normal. No respiratory distress. He has no wheezes. He has no rales.  Abdominal: Soft. Bowel sounds are normal. He exhibits no distension and no mass. There is no tenderness.  Musculoskeletal: Normal range  of motion. He exhibits no edema.  L last ribs and mid lumbar vert st. Tender to palp.  Lymphadenopathy:    He has no cervical adenopathy.  Neurological: He is alert.      Recent Results (from the past 2160 hour(s))  Comprehensive metabolic panel     Status: Abnormal   Collection Time: 07/26/15  1:53 PM  Result Value Ref Range   Sodium 139 135 - 145 mmol/L   Potassium 3.9 3.5 - 5.1 mmol/L   Chloride 105 101 - 111 mmol/L   CO2 27 22 - 32 mmol/L   Glucose, Bld 128 (H) 65 - 99 mg/dL   BUN 25 (H) 6 - 20 mg/dL   Creatinine, Ser 1.08 0.61 -  1.24 mg/dL   Calcium 8.9 8.9 - 10.3 mg/dL   Total Protein 7.2 6.5 - 8.1 g/dL   Albumin 3.9 3.5 - 5.0 g/dL   AST 47 (H) 15 - 41 U/L   ALT 29 17 - 63 U/L   Alkaline Phosphatase 62 38 - 126 U/L   Total Bilirubin 1.0 0.3 - 1.2 mg/dL   GFR calc non Af Amer >60 >60 mL/min   GFR calc Af Amer >60 >60 mL/min    Comment: (NOTE) The eGFR has been calculated using the CKD EPI equation. This calculation has not been validated in all clinical situations. eGFR's persistently <60 mL/min signify possible Chronic Kidney Disease.    Anion gap 7 5 - 15  CBC with Differential     Status: Abnormal   Collection Time: 07/26/15  1:53 PM  Result Value Ref Range   WBC 8.7 3.8 - 10.6 K/uL   RBC 4.00 (L) 4.40 - 5.90 MIL/uL   Hemoglobin 12.7 (L) 13.0 - 18.0 g/dL   HCT 37.1 (L) 40.0 - 52.0 %   MCV 92.9 80.0 - 100.0 fL   MCH 31.7 26.0 - 34.0 pg   MCHC 34.1 32.0 - 36.0 g/dL   RDW 12.8 11.5 - 14.5 %   Platelets 209 150 - 440 K/uL   Neutrophils Relative % 70 %   Neutro Abs 6.1 1.4 - 6.5 K/uL   Lymphocytes Relative 17 %   Lymphs Abs 1.4 1.0 - 3.6 K/uL   Monocytes Relative 11 %   Monocytes Absolute 1.0 0.2 - 1.0 K/uL   Eosinophils Relative 2 %   Eosinophils Absolute 0.2 0 - 0.7 K/uL   Basophils Relative 0 %   Basophils Absolute 0.0 0 - 0.1 K/uL  APTT     Status: None   Collection Time: 07/26/15  1:53 PM  Result Value Ref Range   aPTT 30 24 - 36 seconds  Protime-INR     Status: None   Collection Time: 07/26/15  1:53 PM  Result Value Ref Range   Prothrombin Time 14.2 11.4 - 15.0 seconds   INR 1.08   Urinalysis complete, with microscopic     Status: Abnormal   Collection Time: 07/26/15  2:21 PM  Result Value Ref Range   Color, Urine AMBER (A) YELLOW   APPearance CLEAR (A) CLEAR   Glucose, UA NEGATIVE NEGATIVE mg/dL   Bilirubin Urine NEGATIVE NEGATIVE   Ketones, ur NEGATIVE NEGATIVE mg/dL   Specific Gravity, Urine 1.030 1.005 - 1.030   Hgb urine dipstick NEGATIVE NEGATIVE   pH 5.0 5.0 - 8.0    Protein, ur NEGATIVE NEGATIVE mg/dL   Nitrite NEGATIVE NEGATIVE   Leukocytes, UA TRACE (A) NEGATIVE   RBC / HPF 0-5 0 - 5 RBC/hpf  WBC, UA 0-5 0 - 5 WBC/hpf   Bacteria, UA NONE SEEN NONE SEEN   Squamous Epithelial / LPF NONE SEEN NONE SEEN   Mucous PRESENT   Basic metabolic panel     Status: Abnormal   Collection Time: 07/27/15  5:36 AM  Result Value Ref Range   Sodium 139 135 - 145 mmol/L   Potassium 3.8 3.5 - 5.1 mmol/L   Chloride 105 101 - 111 mmol/L   CO2 28 22 - 32 mmol/L   Glucose, Bld 108 (H) 65 - 99 mg/dL   BUN 23 (H) 6 - 20 mg/dL   Creatinine, Ser 1.14 0.61 - 1.24 mg/dL   Calcium 8.4 (L) 8.9 - 10.3 mg/dL   GFR calc non Af Amer >60 >60 mL/min   GFR calc Af Amer >60 >60 mL/min    Comment: (NOTE) The eGFR has been calculated using the CKD EPI equation. This calculation has not been validated in all clinical situations. eGFR's persistently <60 mL/min signify possible Chronic Kidney Disease.    Anion gap 6 5 - 15     Assessment & Plan  1. Need for pneumococcal vaccination  - Pneumococcal polysaccharide vaccine 23-valent greater than or equal to 2yo subcutaneous/IM  2. Need for influenza vaccination  - Flu vaccine HIGH DOSE PF (Fluzone High dose)  3. Essential hypertension  - losartan (COZAAR) 100 MG tablet; Take 1 tablet (100 mg total) by mouth daily.  Dispense: 90 tablet; Refill: 3  4. Rib fractures, left, with routine healing, subsequent encounter   5. Pneumothorax with hemothorax, traumatic, subsequent encounter  - DG Chest 2 View; Future

## 2015-08-22 NOTE — Telephone Encounter (Signed)
Patient advised.

## 2015-08-22 NOTE — Telephone Encounter (Signed)
PT CALLED FOR HIS  RESULTS  PT CALL BACK # IS  [phone]

## 2015-08-22 NOTE — Patient Instructions (Addendum)
Continue current meds.  Plan A1c with next labs.

## 2015-10-08 ENCOUNTER — Ambulatory Visit (INDEPENDENT_AMBULATORY_CARE_PROVIDER_SITE_OTHER): Payer: Medicare Other | Admitting: Family Medicine

## 2015-10-08 ENCOUNTER — Encounter: Payer: Self-pay | Admitting: Family Medicine

## 2015-10-08 VITALS — BP 148/77 | HR 73 | Temp 98.2°F | Resp 16 | Ht 71.0 in | Wt 204.2 lb

## 2015-10-08 DIAGNOSIS — R109 Unspecified abdominal pain: Secondary | ICD-10-CM

## 2015-10-08 LAB — POCT URINALYSIS DIPSTICK
Bilirubin, UA: NEGATIVE
Blood, UA: NEGATIVE
GLUCOSE UA: NEGATIVE
KETONES UA: NEGATIVE
LEUKOCYTES UA: NEGATIVE
Nitrite, UA: NEGATIVE
PROTEIN UA: NEGATIVE
Spec Grav, UA: 1.01
Urobilinogen, UA: 0.2
pH, UA: 6.5

## 2015-10-08 NOTE — Progress Notes (Signed)
Name: Jason Craig   MRN: [id number]    DOB: Mar 16, 1941   Date:10/08/2015       Progress Note  Subjective  Chief Complaint  Chief Complaint  Patient presents with  . Flank Pain    Left side swelling. Broke Ribs 10 wks ago.     Flank Pain Associated symptoms include abdominal pain. Pertinent negatives include no chest pain, dysuria, fever, headaches, weakness or weight loss.  Here c/o "swelling " in L lat abd. That has been present since rib fracture on that side 10 weeks ago.  No rib pain now. L. Lat abd is sl. tgender to touch.  He is just concerned that he thin ks swelling should be gone now.  No urinary sx. No problem-specific assessment & plan notes found for this encounter.   Past Medical History  Diagnosis Date  . Hypertension   . Rib fracture 07/11/2015    Horse Accident   . 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 Injury after Horse Accident     Social History  Substance Use Topics  . Smoking status: Former Smoker    Types: Cigarettes    Quit date: 11/25/1983  . Smokeless tobacco: Never Used  . Alcohol Use: No     Current outpatient prescriptions:  .  Ascorbic Acid (VITAMIN C PO), Take 1 tablet by mouth daily., Disp: , Rfl:  .  Calcium Citrate-Vitamin D (CALCIUM + D PO), Take 1 tablet by mouth daily., Disp: , Rfl:  .  Coenzyme Q10 (COQ10 PO), Take 1 capsule by mouth daily., Disp: , Rfl:  .  latanoprost (XALATAN) 0.005 % ophthalmic solution, Place 1 drop into both eyes at bedtime., Disp: , Rfl: 0 .  losartan (COZAAR) 100 MG tablet, Take 1 tablet (100 mg total) by mouth daily., Disp: 90 tablet, Rfl: 3 .  Multiple Vitamin (MULTIVITAMIN WITH MINERALS) TABS tablet, Take 1 tablet by mouth daily., Disp: , Rfl:  .  Omega-3 Fatty Acids (FISH OIL PO), Take 1 capsule by mouth daily., Disp: , Rfl:   No Known Allergies  Review of Systems  Constitutional: Negative for fever, chills, weight loss and malaise/fatigue.  HENT: Negative for hearing loss.   Eyes:  Negative for blurred vision and double vision.  Respiratory: Negative for cough, shortness of breath and wheezing.   Cardiovascular: Negative for chest pain, palpitations and leg swelling.  Gastrointestinal: Positive for abdominal pain. Negative for heartburn and blood in stool.       Swelling in L: lat abd.  Genitourinary: Positive for flank pain. Negative for dysuria, urgency and frequency.  Musculoskeletal: Negative for myalgias and joint pain.  Skin: Negative for rash.  Neurological: Negative for dizziness, weakness and headaches.      Objective  Filed Vitals:   10/08/15 0859  BP: 148/77  Pulse: 73  Temp: 98.2 F (36.8 C)  Resp: 16  Height: _0  (1.803 m)  Weight: 204 lb 3.2 oz (92.625 kg)     Physical Exam  Constitutional: He is oriented to person, place, and time and well-developed, well-nourished, and in no distress. No distress.  HENT:  Head: Normocephalic and atraumatic.  Cardiovascular: Normal rate, regular rhythm and normal heart sounds.  Exam reveals no gallop and no friction rub.   No murmur heard. Pulmonary/Chest: Effort normal and breath sounds normal. No respiratory distress. He has no wheezes. He has no rales.  Abdominal: Soft. Bowel sounds are normal. He exhibits no distension and no mass. There is no tenderness.  Mild buldging to L lat abd wall.  Non tender.  No masses felt.  Bowel sounds sl. hyperactive.  Musculoskeletal: He exhibits no edema.  L. Lat post ribs sl tender where fracture was.  Neurological: He is alert and oriented to person, place, and time.      Recent Results (from the past 2160 hour(s))  Comprehensive metabolic panel     Status: Abnormal   Collection Time: 07/26/15  1:53 PM  Result Value Ref Range   Sodium 139 135 - 145 mmol/L   Potassium 3.9 3.5 - 5.1 mmol/L   Chloride 105 101 - 111 mmol/L   CO2 27 22 - 32 mmol/L   Glucose, Bld 128 (H) 65 - 99 mg/dL   BUN 25 (H) 6 - 20 mg/dL   Creatinine, Ser 1.08 0.61 - 1.24 mg/dL    Calcium 8.9 8.9 - 10.3 mg/dL   Total Protein 7.2 6.5 - 8.1 g/dL   Albumin 3.9 3.5 - 5.0 g/dL   AST 47 (H) 15 - 41 U/L   ALT 29 17 - 63 U/L   Alkaline Phosphatase 62 38 - 126 U/L   Total Bilirubin 1.0 0.3 - 1.2 mg/dL   GFR calc non Af Amer >60 >60 mL/min   GFR calc Af Amer >60 >60 mL/min    Comment: (NOTE) The eGFR has been calculated using the CKD EPI equation. This calculation has not been validated in all clinical situations. eGFR's persistently <60 mL/min signify possible Chronic Kidney Disease.    Anion gap 7 5 - 15  CBC with Differential     Status: Abnormal   Collection Time: 07/26/15  1:53 PM  Result Value Ref Range   WBC 8.7 3.8 - 10.6 K/uL   RBC 4.00 (L) 4.40 - 5.90 MIL/uL   Hemoglobin 12.7 (L) 13.0 - 18.0 g/dL   HCT 37.1 (L) 40.0 - 52.0 %   MCV 92.9 80.0 - 100.0 fL   MCH 31.7 26.0 - 34.0 pg   MCHC 34.1 32.0 - 36.0 g/dL   RDW 12.8 11.5 - 14.5 %   Platelets 209 150 - 440 K/uL   Neutrophils Relative % 70 %   Neutro Abs 6.1 1.4 - 6.5 K/uL   Lymphocytes Relative 17 %   Lymphs Abs 1.4 1.0 - 3.6 K/uL   Monocytes Relative 11 %   Monocytes Absolute 1.0 0.2 - 1.0 K/uL   Eosinophils Relative 2 %   Eosinophils Absolute 0.2 0 - 0.7 K/uL   Basophils Relative 0 %   Basophils Absolute 0.0 0 - 0.1 K/uL  APTT     Status: None   Collection Time: 07/26/15  1:53 PM  Result Value Ref Range   aPTT 30 24 - 36 seconds  Protime-INR     Status: None   Collection Time: 07/26/15  1:53 PM  Result Value Ref Range   Prothrombin Time 14.2 11.4 - 15.0 seconds   INR 1.08   Urinalysis complete, with microscopic     Status: Abnormal   Collection Time: 07/26/15  2:21 PM  Result Value Ref Range   Color, Urine AMBER (A) YELLOW   APPearance CLEAR (A) CLEAR   Glucose, UA NEGATIVE NEGATIVE mg/dL   Bilirubin Urine NEGATIVE NEGATIVE   Ketones, ur NEGATIVE NEGATIVE mg/dL   Specific Gravity, Urine 1.030 1.005 - 1.030   Hgb urine dipstick NEGATIVE NEGATIVE   pH 5.0 5.0 - 8.0   Protein, ur  NEGATIVE NEGATIVE mg/dL   Nitrite NEGATIVE NEGATIVE   Leukocytes, UA  TRACE (A) NEGATIVE   RBC / HPF 0-5 0 - 5 RBC/hpf   WBC, UA 0-5 0 - 5 WBC/hpf   Bacteria, UA NONE SEEN NONE SEEN   Squamous Epithelial / LPF NONE SEEN NONE SEEN   Mucous PRESENT   Basic metabolic panel     Status: Abnormal   Collection Time: 07/27/15  5:36 AM  Result Value Ref Range   Sodium 139 135 - 145 mmol/L   Potassium 3.8 3.5 - 5.1 mmol/L   Chloride 105 101 - 111 mmol/L   CO2 28 22 - 32 mmol/L   Glucose, Bld 108 (H) 65 - 99 mg/dL   BUN 23 (H) 6 - 20 mg/dL   Creatinine, Ser 1.14 0.61 - 1.24 mg/dL   Calcium 8.4 (L) 8.9 - 10.3 mg/dL   GFR calc non Af Amer >60 >60 mL/min   GFR calc Af Amer >60 >60 mL/min    Comment: (NOTE) The eGFR has been calculated using the CKD EPI equation. This calculation has not been validated in all clinical situations. eGFR's persistently <60 mL/min signify possible Chronic Kidney Disease.    Anion gap 6 5 - 15  POCT Urinalysis Dipstick     Status: Normal   Collection Time: 10/08/15  8:58 AM  Result Value Ref Range   Color, UA yellow    Clarity, UA clear    Glucose, UA neg    Bilirubin, UA neg    Ketones, UA neg    Spec Grav, UA 1.010    Blood, UA neg    pH, UA 6.5    Protein, UA neg    Urobilinogen, UA 0.2    Nitrite, UA neg    Leukocytes, UA Negative Negative     Assessment & Plan  1. Flank pain- secondary to fall and fractured ribs.  ? Abdominal wall hernia. - POCT Urinalysis Dipstick-wnl

## 2015-10-10 DIAGNOSIS — H353112 Nonexudative age-related macular degeneration, right eye, intermediate dry stage: Secondary | ICD-10-CM | POA: Diagnosis not present

## 2015-10-10 DIAGNOSIS — H353221 Exudative age-related macular degeneration, left eye, with active choroidal neovascularization: Secondary | ICD-10-CM | POA: Diagnosis not present

## 2015-10-17 DIAGNOSIS — H353221 Exudative age-related macular degeneration, left eye, with active choroidal neovascularization: Secondary | ICD-10-CM | POA: Diagnosis not present

## 2015-11-06 DIAGNOSIS — H353112 Nonexudative age-related macular degeneration, right eye, intermediate dry stage: Secondary | ICD-10-CM | POA: Diagnosis not present

## 2015-11-06 DIAGNOSIS — H401131 Primary open-angle glaucoma, bilateral, mild stage: Secondary | ICD-10-CM | POA: Diagnosis not present

## 2015-11-06 DIAGNOSIS — H353223 Exudative age-related macular degeneration, left eye, with inactive scar: Secondary | ICD-10-CM | POA: Diagnosis not present

## 2015-12-18 DIAGNOSIS — H353112 Nonexudative age-related macular degeneration, right eye, intermediate dry stage: Secondary | ICD-10-CM | POA: Diagnosis not present

## 2015-12-18 DIAGNOSIS — H353221 Exudative age-related macular degeneration, left eye, with active choroidal neovascularization: Secondary | ICD-10-CM | POA: Diagnosis not present

## 2015-12-18 DIAGNOSIS — H43813 Vitreous degeneration, bilateral: Secondary | ICD-10-CM | POA: Diagnosis not present

## 2016-02-11 DIAGNOSIS — H353223 Exudative age-related macular degeneration, left eye, with inactive scar: Secondary | ICD-10-CM | POA: Diagnosis not present

## 2016-02-11 DIAGNOSIS — H401131 Primary open-angle glaucoma, bilateral, mild stage: Secondary | ICD-10-CM | POA: Diagnosis not present

## 2016-02-11 DIAGNOSIS — H43813 Vitreous degeneration, bilateral: Secondary | ICD-10-CM | POA: Diagnosis not present

## 2016-02-11 DIAGNOSIS — H353112 Nonexudative age-related macular degeneration, right eye, intermediate dry stage: Secondary | ICD-10-CM | POA: Diagnosis not present

## 2016-02-11 DIAGNOSIS — H25813 Combined forms of age-related cataract, bilateral: Secondary | ICD-10-CM | POA: Diagnosis not present

## 2016-02-14 DIAGNOSIS — H43813 Vitreous degeneration, bilateral: Secondary | ICD-10-CM | POA: Diagnosis not present

## 2016-02-14 DIAGNOSIS — H353221 Exudative age-related macular degeneration, left eye, with active choroidal neovascularization: Secondary | ICD-10-CM | POA: Diagnosis not present

## 2016-02-14 DIAGNOSIS — H353112 Nonexudative age-related macular degeneration, right eye, intermediate dry stage: Secondary | ICD-10-CM | POA: Diagnosis not present

## 2016-02-17 ENCOUNTER — Other Ambulatory Visit: Payer: Self-pay | Admitting: Family Medicine

## 2016-02-17 DIAGNOSIS — I10 Essential (primary) hypertension: Secondary | ICD-10-CM

## 2016-02-17 MED ORDER — LOSARTAN POTASSIUM 100 MG PO TABS: 100.0000 mg | ORAL_TABLET | Freq: Every day | ORAL | Status: DC

## 2016-02-24 ENCOUNTER — Ambulatory Visit (INDEPENDENT_AMBULATORY_CARE_PROVIDER_SITE_OTHER): Payer: Medicare Other | Admitting: Family Medicine

## 2016-02-24 ENCOUNTER — Encounter: Payer: Self-pay | Admitting: Family Medicine

## 2016-02-24 VITALS — BP 120/60 | HR 66 | Resp 16 | Ht 71.0 in | Wt 202.0 lb

## 2016-02-24 DIAGNOSIS — I10 Essential (primary) hypertension: Secondary | ICD-10-CM | POA: Diagnosis not present

## 2016-02-24 DIAGNOSIS — K439 Ventral hernia without obstruction or gangrene: Secondary | ICD-10-CM | POA: Diagnosis not present

## 2016-02-24 NOTE — Progress Notes (Signed)
Name: Jason Craig   MRN: [id number]    DOB: Jan 24, 1941   Date:02/24/2016       Progress Note  Subjective  Chief Complaint  Chief Complaint  Patient presents with  . Hypertension    HPI Here for f/u of HBP.  Has hx of fractured L ribs > 6 months ago.  Has some bujldge at L lat., abd that may be abd. Wall hernia.  No pain.  It is not enlarging.  Otherwise feels well.  No problem-specific assessment & plan notes found for this encounter.   Past Medical History  Diagnosis Date  . Hypertension   . Rib fracture 07/11/2015    Horse Accident   . 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 Injury after Horse Accident     Past Surgical History  Procedure Laterality Date  . Cholecystectomy  2012    Dr. Burt Knack    Family History  Problem Relation Age of Onset  . Heart disease Mother   . Heart disease Father     Social History   Social History  . Marital Status: Married    Spouse Name: N/A  . Number of Children: N/A  . Years of Education: N/A   Occupational History  . Not on file.   Social History Main Topics  . Smoking status: Former Smoker    Types: Cigarettes    Quit date: 11/25/1983  . Smokeless tobacco: Never Used  . Alcohol Use: No  . Drug Use: No  . Sexual Activity: Not on file   Other Topics Concern  . Not on file   Social History Narrative     Current outpatient prescriptions:  .  Ascorbic Acid (VITAMIN C PO), Take 1 tablet by mouth daily., Disp: , Rfl:  .  Calcium Citrate-Vitamin D (CALCIUM + D PO), Take 1 tablet by mouth daily., Disp: , Rfl:  .  Coenzyme Q10 (COQ10 PO), Take 1 capsule by mouth daily., Disp: , Rfl:  .  latanoprost (XALATAN) 0.005 % ophthalmic solution, Place 1 drop into both eyes at bedtime., Disp: , Rfl: 0 .  losartan (COZAAR) 100 MG tablet, Take 1 tablet (100 mg total) by mouth daily., Disp: 90 tablet, Rfl: 3 .  Multiple Vitamin (MULTIVITAMIN WITH MINERALS) TABS tablet, Take 1 tablet by mouth daily., Disp: , Rfl:  .   Omega-3 Fatty Acids (FISH OIL PO), Take 1 capsule by mouth daily., Disp: , Rfl:  .  tobramycin (TOBREX) 0.3 % ophthalmic solution, instill 1 drop into left eye four times a day  - BEGIN 1 DAY PRIO...  (REFER TO PRESCRIPTION NOTES)., Disp: , Rfl: 0  Not on File   Review of Systems  Constitutional: Negative for fever, chills, weight loss and malaise/fatigue.  HENT: Negative for hearing loss.   Eyes: Negative for blurred vision and double vision.  Respiratory: Negative for cough, shortness of breath and wheezing.   Cardiovascular: Negative for chest pain, palpitations and leg swelling.  Gastrointestinal: Negative for heartburn, abdominal pain and blood in stool.  Genitourinary: Negative for dysuria, urgency and frequency.  Musculoskeletal: Negative for myalgias and joint pain.  Skin: Negative for rash.  Neurological: Negative for dizziness, tremors, weakness and headaches.      Objective  Filed Vitals:   02/24/16 0835 02/24/16 0928  BP: 138/70 120/60  Pulse: 66   Resp: 16   Height: 5\' 11"  (1.803 m)   Weight: 202 lb (91.627 kg)     Physical Exam  Constitutional: He is  oriented to person, place, and time and well-developed, well-nourished, and in no distress. No distress.  HENT:  Head: Normocephalic and atraumatic.  Eyes: Conjunctivae and EOM are normal. Pupils are equal, round, and reactive to light.  Neck: Normal range of motion. Neck supple. Carotid bruit is not present. No thyromegaly present.  Cardiovascular: Normal rate, regular rhythm and normal heart sounds.  Exam reveals no gallop and no friction rub.   No murmur heard. Pulmonary/Chest: Effort normal and breath sounds normal. No respiratory distress. He has no wheezes. He has no rales. He exhibits no tenderness.  Abdominal: Soft. Bowel sounds are normal. He exhibits no distension, no abdominal bruit and no mass. There is no tenderness.  L lateral abd. wall hernia without tenderness.    Lymphadenopathy:    He has no  cervical adenopathy.  Neurological: He is alert and oriented to person, place, and time.  Vitals reviewed.      No results found for this or any previous visit (from the past 2160 hour(s)).   Assessment & Plan  Problem List Items Addressed This Visit      Cardiovascular and Mediastinum   BP (high blood pressure) - Primary     Other   Hernia of abdominal wall      Meds ordered this encounter  Medications  . tobramycin (TOBREX) 0.3 % ophthalmic solution    Sig: instill 1 drop into left eye four times a day  - BEGIN 1 DAY PRIO...  (REFER TO PRESCRIPTION NOTES).    Refill:  0   1. Essential hypertension Cont. Losartan, 100 mg/d RTC-6 mos.   2. Hernia of abdominal wall

## 2016-04-17 DIAGNOSIS — H43813 Vitreous degeneration, bilateral: Secondary | ICD-10-CM | POA: Diagnosis not present

## 2016-04-17 DIAGNOSIS — H353221 Exudative age-related macular degeneration, left eye, with active choroidal neovascularization: Secondary | ICD-10-CM | POA: Diagnosis not present

## 2016-04-17 DIAGNOSIS — H353112 Nonexudative age-related macular degeneration, right eye, intermediate dry stage: Secondary | ICD-10-CM | POA: Diagnosis not present

## 2016-05-11 DIAGNOSIS — H401131 Primary open-angle glaucoma, bilateral, mild stage: Secondary | ICD-10-CM | POA: Diagnosis not present

## 2016-05-11 DIAGNOSIS — H353112 Nonexudative age-related macular degeneration, right eye, intermediate dry stage: Secondary | ICD-10-CM | POA: Diagnosis not present

## 2016-05-11 DIAGNOSIS — H43813 Vitreous degeneration, bilateral: Secondary | ICD-10-CM | POA: Diagnosis not present

## 2016-06-16 IMAGING — CT CT CERVICAL SPINE W/O CM
2 of 3 series · 8 of 14 positions shown, 9 images · non-contrast
Comparison: None.

CLINICAL DATA: Patient thrown office worse last night

EXAM:
CT HEAD WITHOUT CONTRAST
CT CERVICAL SPINE WITHOUT CONTRAST
TECHNIQUE: Multidetector CT imaging of the head and cervical spine was
performed following the standard protocol without intravenous
contrast. Multiplanar CT image reconstructions of the cervical spine
were also generated.

[Series 5: c spine soft · axial · 0.30mm/px · z∈[-178,-64]mm · 4 of 95 slices shown]
[im 19/95  soft-tissue]
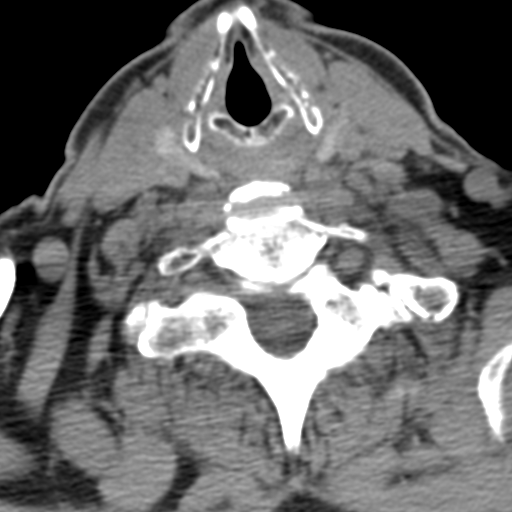
[im 38/95  soft-tissue]
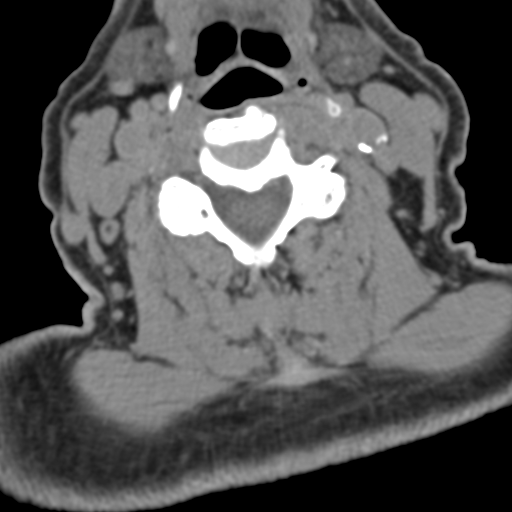
[im 57/95  soft-tissue]
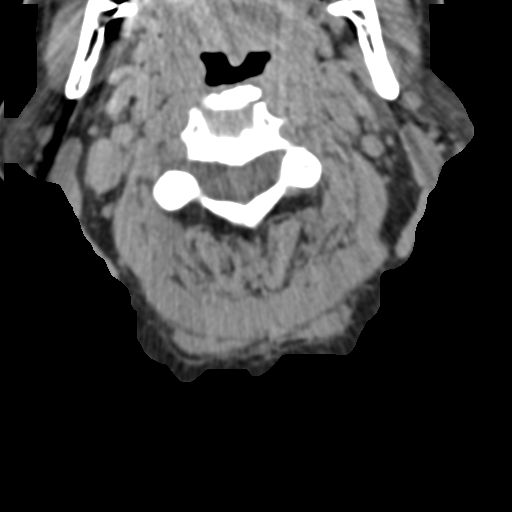
[im 76/95  soft-tissue]
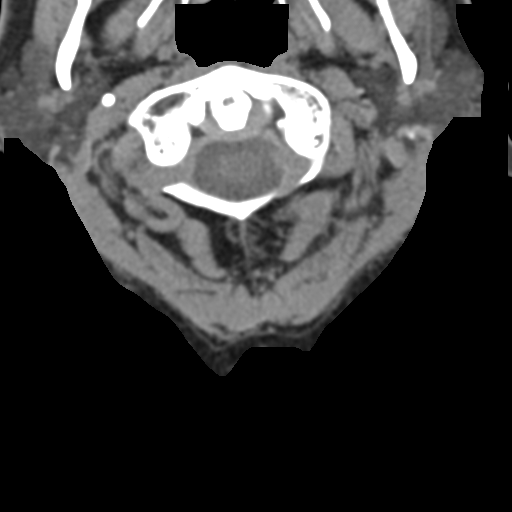

[Series 8: orthogonal axials · axial · 0.29mm/px · z∈[-204,-97]mm · 4 of 96 slices shown, 5 images]
[im 20/96  soft-tissue]
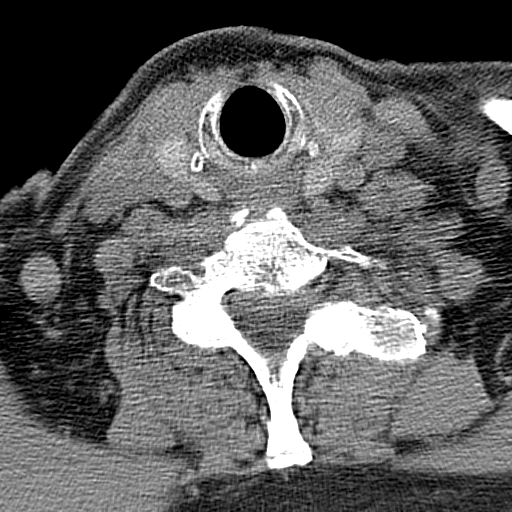
[im 20/96  bone]
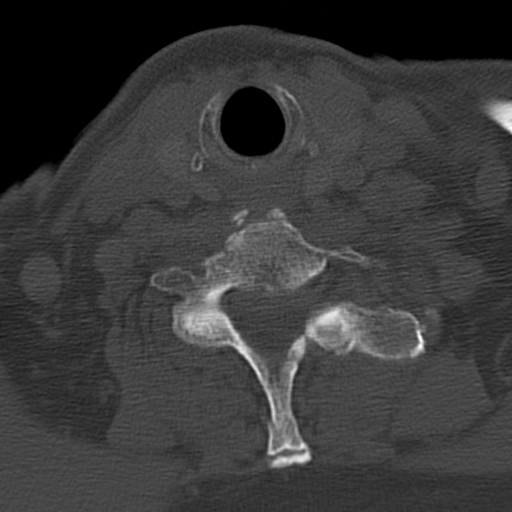
[im 39/96  bone]
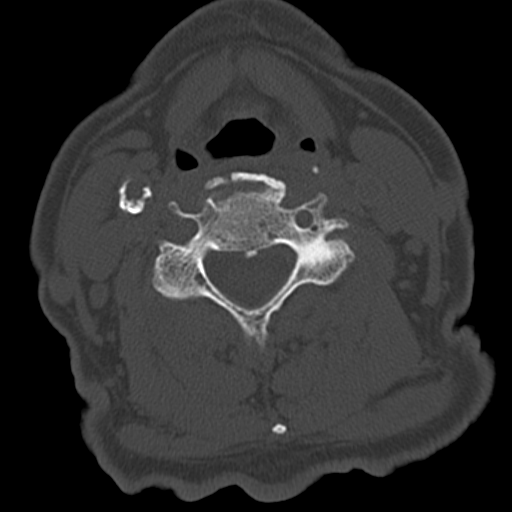
[im 58/96  bone]
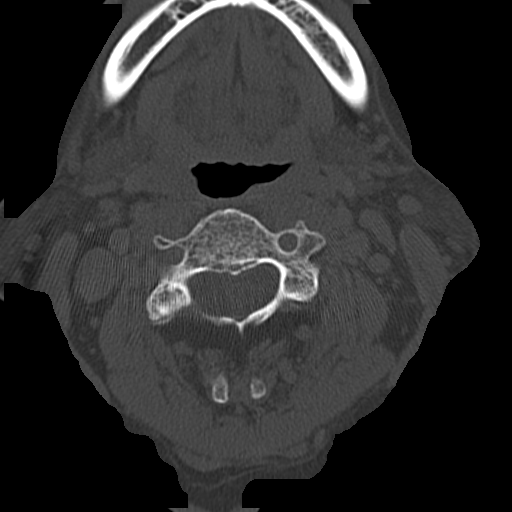
[im 77/96  bone]
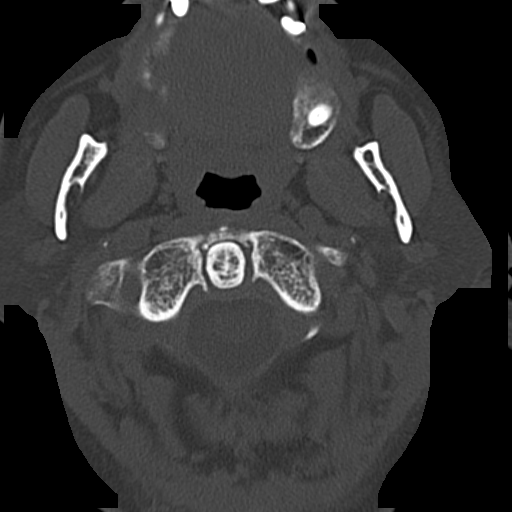

[8 of 14 positions shown; findings below may reference images not displayed]

FINDINGS: CT HEAD FINDINGS

Normal attenuation allowing for intravenous contrast administration,
which could potentially disguise small foci of hemorrhage. No
extra-axial hemorrhage or parenchymal hematoma. No evidence of
infarct or mass. Calvarium is intact. Moderate chronic appearing
inflammatory change in the visualized portions of the maxillary and
ethmoid air cells.

CT CERVICAL SPINE FINDINGS

Normal alignment. No prevertebral soft tissue swelling. No fracture.
Large multilevel anterior osteophytes.

Small pneumothorax noted on the left as described on report of CT
thorax performed earlier today.
IMPRESSION: No acute abnormality cervical spine. No acute intracranial
abnormalities allowing for recent intravenous contrast
administration.

## 2016-06-16 IMAGING — CT CT ABD-PELV W/ CM
1 of 3 series · 12 of 32 positions shown, 17 images · IV contrast (omnipaque)
Comparison: None.

ADDENDUM:
I marked and measured a right middle lobe lung nodule but failed to
mention in the report. There is a 5 mm right middle lobe subpleural
pulmonary nodule on image number 43. If the patient is at high risk
for bronchogenic carcinoma, follow-up chest CT at 6-12 months is
recommended. If the patient is at low risk for bronchogenic
carcinoma, follow-up chest CT at 12 months is recommended. This
recommendation follows the consensus statement: Guidelines for
Management of Small Pulmonary Nodules Detected on CT Scans: A
Statement from the [HOSPITAL] as published in Radiology
9002;[DATE].
CLINICAL DATA: Thrown from a horse today.  Left flank pain.

EXAM:
CT CHEST, ABDOMEN, AND PELVIS WITH CONTRAST
TECHNIQUE: Multidetector CT imaging of the chest, abdomen and pelvis was
performed following the standard protocol during bolus
administration of intravenous contrast.
CONTRAST:  100mL OMNIPAQUE IOHEXOL 300 MG/ML  SOLN

[Series 2: cap with · axial · 0.88mm/px · z∈[-1264,-624]mm · 12 of 144 slices shown, 17 images]
[im 8/144  soft-tissue]
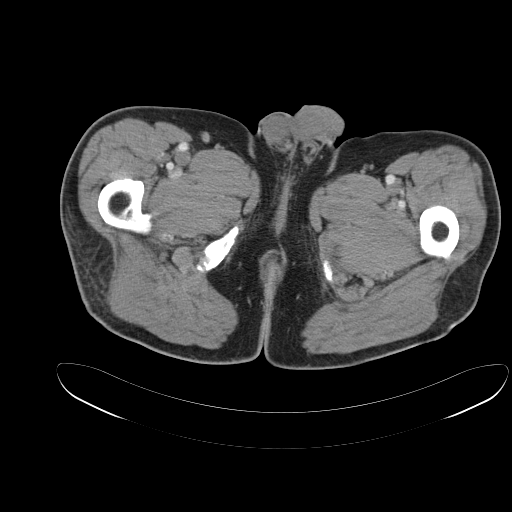
[im 8/144  bone]
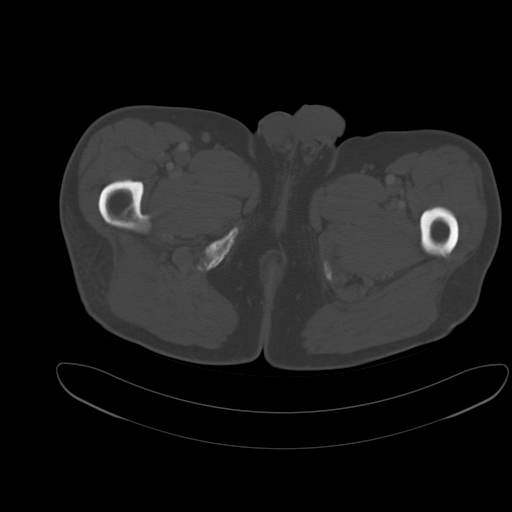
[im 24/144  soft-tissue]
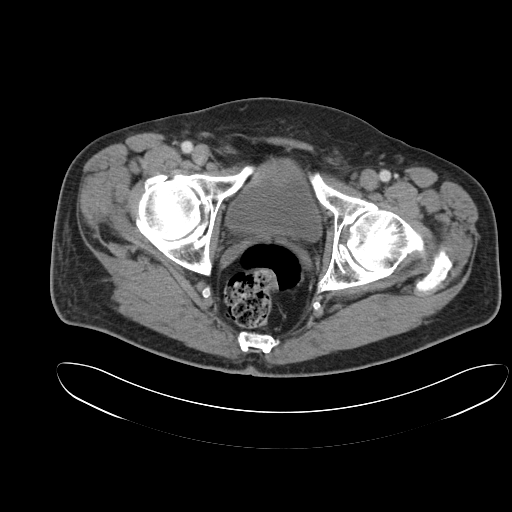
[im 32/144  soft-tissue]
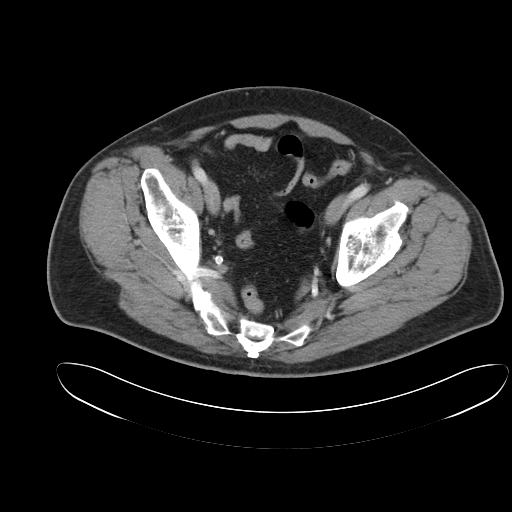
[im 48/144  soft-tissue]
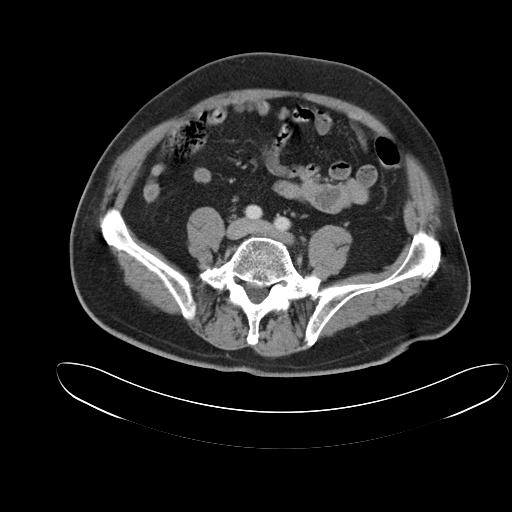
[im 56/144  soft-tissue]
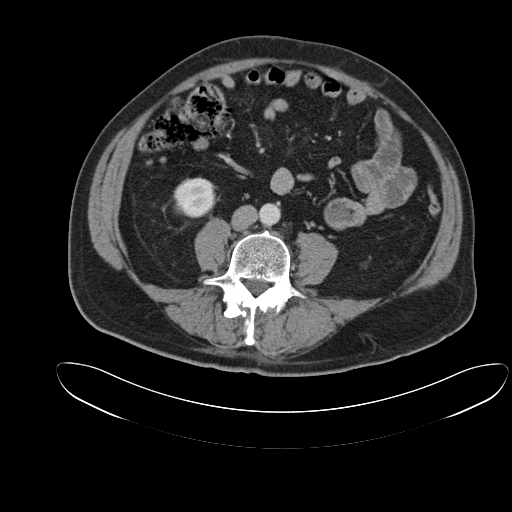
[im 72/144  soft-tissue]
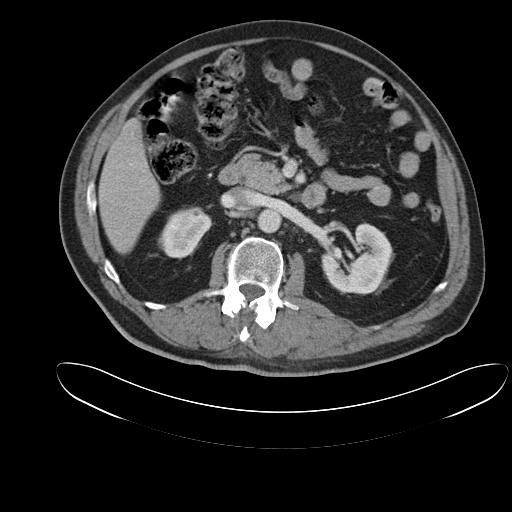
[im 88/144  soft-tissue]
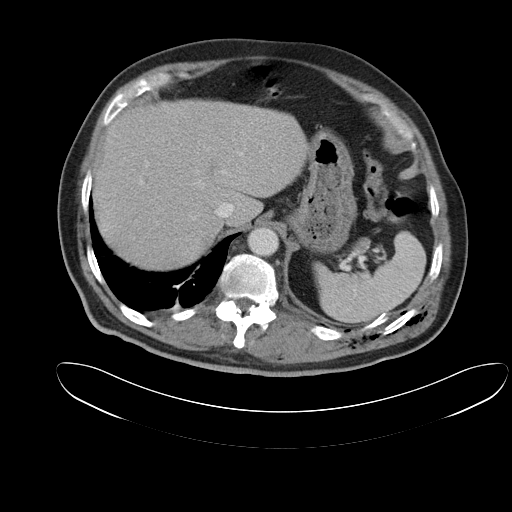
[im 96/144  soft-tissue]
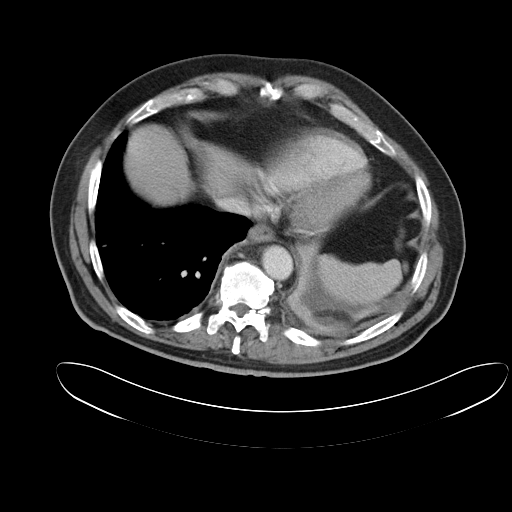
[im 112/144  soft-tissue]
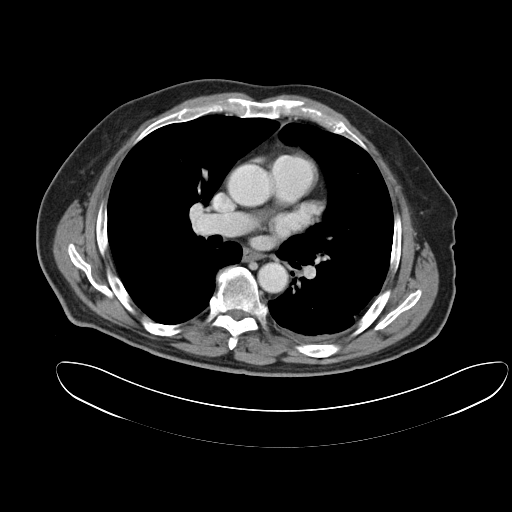
[im 112/144  lung]
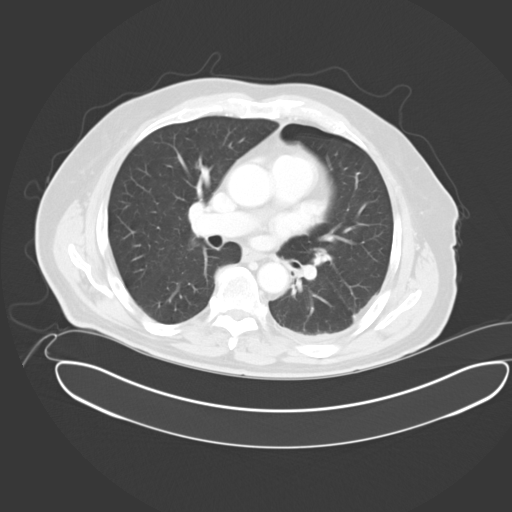
[im 112/144  bone]
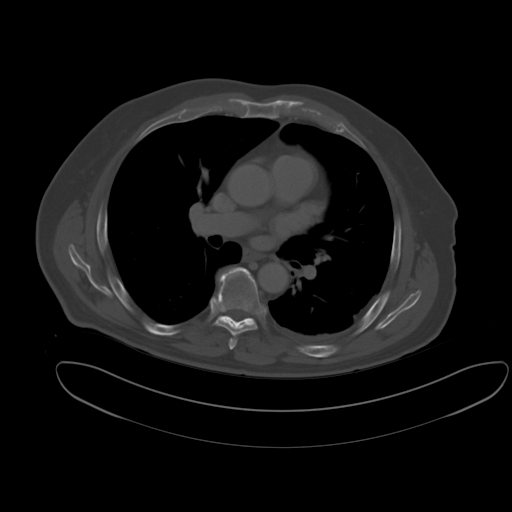
[im 120/144  soft-tissue]
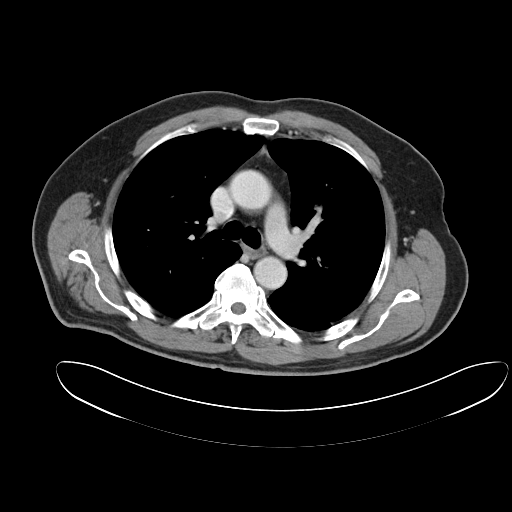
[im 120/144  lung]
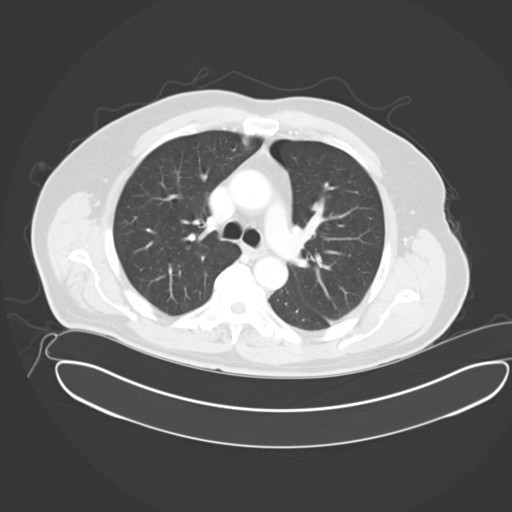
[im 128/144  lung]
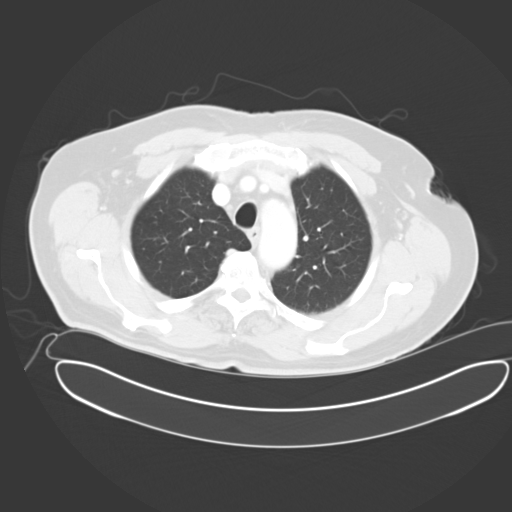
[im 136/144  soft-tissue]
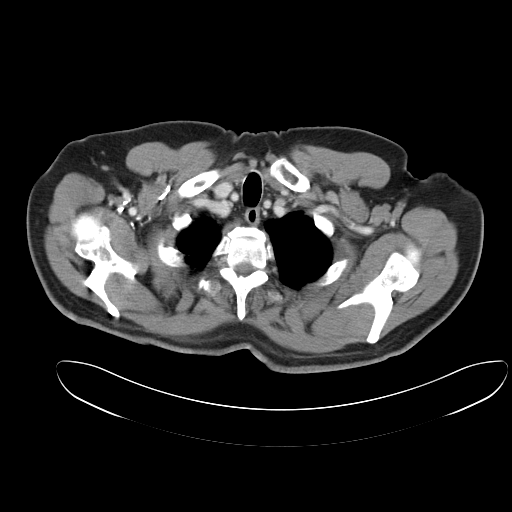
[im 136/144  lung]
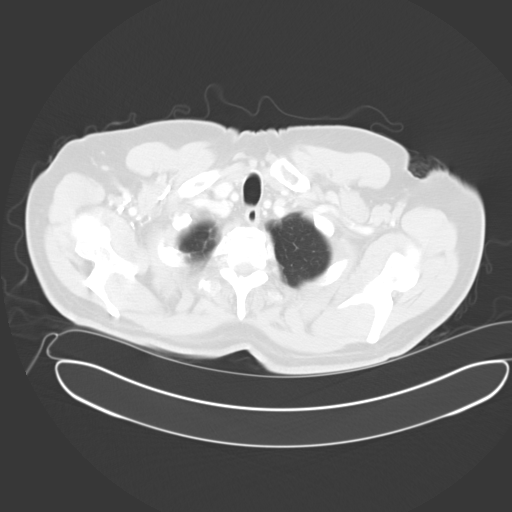

[12 of 32 positions shown; findings below may reference images not displayed]

FINDINGS: CT CHEST FINDINGS

Chest wall: No chest wall mass or hematoma. No supraclavicular or
axillary adenopathy. The thyroid gland appears normal.

There are multiple left lower rib fractures. The eighth, ninth,
tenth, eleventh and twelfth left ribs are fractured. The tenth rib
fracture is displaced. The tenth, eleventh and twelfth ribs are
fractured in 2 places, near the spine and more posterior laterally.
There are also fractures of the first and second left transverse
processes. The thoracic and lumbar vertebral bodies are normally
aligned. No acute compression fracture. Advanced facet disease noted
in the thoracolumbar spine.

Mediastinum: The heart is upper limits of normal in size. No
pericardial effusion. No mediastinal hematoma. No sternal fracture
or substernal hematoma. The aorta is normal in caliber. No
dissection. Coronary artery calcifications are noted. The esophagus
is grossly normal.

Lungs/ pleura: There is a the small left-sided pneumothorax
estimated at 5-10%. There is also subcutaneous emphysema involving
the left lower lateral posterior chest wall. There is left lower
lobe atelectasis or pulmonary contusion along with a small left
pleural effusion.

CT ABDOMEN AND PELVIS FINDINGS

Hepatobiliary: No focal hepatic lesions or intrahepatic biliary
dilatation. A few small calcified granulomas are noted. No acute
hepatic injury. No perihepatic fluid collections. The gallbladder is
surgically absent. No common bile duct dilatation.

Pancreas: No mass, inflammation or acute injury. No peripancreatic
fluid collections.

Spleen: Normal size. No focal lesions. No acute injury. No
periapical splenic fluid collections.

Adrenals/Urinary Tract: The adrenal glands are normal. Both kidneys
are normal. No acute injury.

Stomach/Bowel: The stomach, duodenum, small bowel and colon are
grossly normal without oral contrast. The terminal ileum is normal.
The appendix is normal.

Vascular/Lymphatic: No mesenteric or retroperitoneal mass,
adenopathy or hematoma. The aorta and branch vessels are patent. The
major venous structures are patent.

Other: The bladder, prostate gland and seminal vesicles are normal.
The rectum and sigmoid colon are normal. No pelvic mass or
adenopathy. No hematoma. No inguinal mass or adenopathy.

Musculoskeletal: No significant bony findings involving the lumbar
spine or pelvis. Moderate hip joint degenerative changes
bilaterally. The pubic symphysis and SI joints are intact
degenerative changes are noted and partial fusion of the SI joints
are noted.
IMPRESSION: 1. 8-12 left posterior rib fractures as discussed above. The tenth
rib fracture is displaced.
2. Small, 5-10 percent, left-sided pneumothorax.
3. Left lower lobe atelectasis and/or contusion with small pleural
effusion.
4. No acute intra-abdominal/pelvic injuries are identified.
5. Nondisplaced transverse process fractures of L1 and L2.

## 2016-07-01 DIAGNOSIS — H353221 Exudative age-related macular degeneration, left eye, with active choroidal neovascularization: Secondary | ICD-10-CM | POA: Diagnosis not present

## 2016-07-01 DIAGNOSIS — H353112 Nonexudative age-related macular degeneration, right eye, intermediate dry stage: Secondary | ICD-10-CM | POA: Diagnosis not present

## 2016-07-01 DIAGNOSIS — H43813 Vitreous degeneration, bilateral: Secondary | ICD-10-CM | POA: Diagnosis not present

## 2016-07-03 DIAGNOSIS — H18413 Arcus senilis, bilateral: Secondary | ICD-10-CM | POA: Diagnosis not present

## 2016-07-03 DIAGNOSIS — H02839 Dermatochalasis of unspecified eye, unspecified eyelid: Secondary | ICD-10-CM | POA: Diagnosis not present

## 2016-07-03 DIAGNOSIS — H2513 Age-related nuclear cataract, bilateral: Secondary | ICD-10-CM | POA: Diagnosis not present

## 2016-07-03 DIAGNOSIS — I10 Essential (primary) hypertension: Secondary | ICD-10-CM | POA: Diagnosis not present

## 2016-07-10 DIAGNOSIS — H353223 Exudative age-related macular degeneration, left eye, with inactive scar: Secondary | ICD-10-CM | POA: Diagnosis not present

## 2016-07-10 DIAGNOSIS — H401131 Primary open-angle glaucoma, bilateral, mild stage: Secondary | ICD-10-CM | POA: Diagnosis not present

## 2016-07-10 DIAGNOSIS — H25813 Combined forms of age-related cataract, bilateral: Secondary | ICD-10-CM | POA: Diagnosis not present

## 2016-07-10 DIAGNOSIS — H1089 Other conjunctivitis: Secondary | ICD-10-CM | POA: Diagnosis not present

## 2016-08-27 ENCOUNTER — Ambulatory Visit (INDEPENDENT_AMBULATORY_CARE_PROVIDER_SITE_OTHER): Payer: Medicare Other | Admitting: Family Medicine

## 2016-08-27 ENCOUNTER — Encounter: Payer: Self-pay | Admitting: *Deleted

## 2016-08-27 ENCOUNTER — Encounter: Payer: Self-pay | Admitting: Family Medicine

## 2016-08-27 VITALS — BP 140/70 | HR 61 | Temp 98.1°F | Resp 16 | Ht 71.0 in | Wt 202.0 lb

## 2016-08-27 DIAGNOSIS — I10 Essential (primary) hypertension: Secondary | ICD-10-CM | POA: Diagnosis not present

## 2016-08-27 DIAGNOSIS — Z23 Encounter for immunization: Secondary | ICD-10-CM
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
## 2016-08-31 DIAGNOSIS — H25812 Combined forms of age-related cataract, left eye: Secondary | ICD-10-CM | POA: Diagnosis not present

## 2016-08-31 DIAGNOSIS — H353112 Nonexudative age-related macular degeneration, right eye, intermediate dry stage: Secondary | ICD-10-CM | POA: Diagnosis not present

## 2016-08-31 DIAGNOSIS — H353223 Exudative age-related macular degeneration, left eye, with inactive scar: Secondary | ICD-10-CM | POA: Diagnosis not present

## 2016-08-31 DIAGNOSIS — H25811 Combined forms of age-related cataract, right eye: Secondary | ICD-10-CM | POA: Diagnosis not present

## 2016-08-31 DIAGNOSIS — H401131 Primary open-angle glaucoma, bilateral, mild stage: Secondary | ICD-10-CM | POA: Diagnosis not present

## 2016-08-31 DIAGNOSIS — H2511 Age-related nuclear cataract, right eye: Secondary | ICD-10-CM | POA: Diagnosis not present

## 2016-09-01 DIAGNOSIS — H2512 Age-related nuclear cataract, left eye: Secondary | ICD-10-CM | POA: Diagnosis not present

## 2016-09-16 DIAGNOSIS — Z1283 Encounter for screening for malignant neoplasm of skin: Secondary | ICD-10-CM | POA: Diagnosis not present

## 2016-09-16 DIAGNOSIS — L400 Psoriasis vulgaris: Secondary | ICD-10-CM | POA: Diagnosis not present

## 2016-09-16 DIAGNOSIS — L570 Actinic keratosis: Secondary | ICD-10-CM | POA: Diagnosis not present

## 2016-09-21 DIAGNOSIS — H2512 Age-related nuclear cataract, left eye: Secondary | ICD-10-CM | POA: Diagnosis not present

## 2016-09-21 DIAGNOSIS — H52202 Unspecified astigmatism, left eye: Secondary | ICD-10-CM | POA: Diagnosis not present

## 2016-09-22 DIAGNOSIS — H25812 Combined forms of age-related cataract, left eye: Secondary | ICD-10-CM | POA: Diagnosis not present

## 2016-09-22 DIAGNOSIS — H43813 Vitreous degeneration, bilateral: Secondary | ICD-10-CM | POA: Diagnosis not present

## 2016-09-22 DIAGNOSIS — H401131 Primary open-angle glaucoma, bilateral, mild stage: Secondary | ICD-10-CM | POA: Diagnosis not present

## 2016-09-22 DIAGNOSIS — H353112 Nonexudative age-related macular degeneration, right eye, intermediate dry stage: Secondary | ICD-10-CM | POA: Diagnosis not present

## 2016-09-29 DIAGNOSIS — H353112 Nonexudative age-related macular degeneration, right eye, intermediate dry stage: Secondary | ICD-10-CM | POA: Diagnosis not present

## 2016-09-29 DIAGNOSIS — H43813 Vitreous degeneration, bilateral: Secondary | ICD-10-CM | POA: Diagnosis not present

## 2016-09-29 DIAGNOSIS — H353221 Exudative age-related macular degeneration, left eye, with active choroidal neovascularization: Secondary | ICD-10-CM | POA: Diagnosis not present

## 2016-11-25 ENCOUNTER — Other Ambulatory Visit: Payer: Self-pay | Admitting: Family Medicine

## 2016-11-25 DIAGNOSIS — I10 Essential (primary) hypertension: Secondary | ICD-10-CM

## 2016-11-27 ENCOUNTER — Other Ambulatory Visit: Payer: Self-pay | Admitting: Family Medicine

## 2016-11-27 DIAGNOSIS — I10 Essential (primary) hypertension: Secondary | ICD-10-CM

## 2016-11-27 MED ORDER — LOSARTAN POTASSIUM 100 MG PO TABS: 100.0000 mg | ORAL_TABLET | Freq: Every day | ORAL | 1 refills | Status: DC

## 2016-12-15 DIAGNOSIS — H353114 Nonexudative age-related macular degeneration, right eye, advanced atrophic with subfoveal involvement: Secondary | ICD-10-CM | POA: Diagnosis not present

## 2016-12-15 DIAGNOSIS — H43813 Vitreous degeneration, bilateral: Secondary | ICD-10-CM | POA: Diagnosis not present

## 2016-12-15 DIAGNOSIS — H353222 Exudative age-related macular degeneration, left eye, with inactive choroidal neovascularization: Secondary | ICD-10-CM | POA: Diagnosis not present

## 2016-12-26 ENCOUNTER — Encounter: Payer: Self-pay | Admitting: Registered Nurse

## 2016-12-26 ENCOUNTER — Encounter: Payer: Self-pay | Admitting: Emergency Medicine

## 2016-12-26 ENCOUNTER — Encounter
Admission: EM | Disposition: A | Discharge status: Home / Self Care | Payer: Self-pay | Source: Home / Self Care | Attending: Internal Medicine

## 2016-12-26 ENCOUNTER — Inpatient Hospital Stay
Admission: EM | Admit: 2016-12-26 | Discharge: 2016-12-28 | DRG: 247 | Disposition: A | Discharge status: Home / Self Care | Payer: Medicare Other | Attending: Internal Medicine | Admitting: Internal Medicine

## 2016-12-26 DIAGNOSIS — I2102 ST elevation (STEMI) myocardial infarction involving left anterior descending coronary artery: Secondary | ICD-10-CM | POA: Diagnosis not present

## 2016-12-26 DIAGNOSIS — I213 ST elevation (STEMI) myocardial infarction of unspecified site: Secondary | ICD-10-CM | POA: Diagnosis present

## 2016-12-26 DIAGNOSIS — Z79899 Other long term (current) drug therapy: Secondary | ICD-10-CM | POA: Diagnosis not present

## 2016-12-26 DIAGNOSIS — Z8249 Family history of ischemic heart disease and other diseases of the circulatory system: Secondary | ICD-10-CM | POA: Diagnosis not present

## 2016-12-26 DIAGNOSIS — R079 Chest pain, unspecified: Secondary | ICD-10-CM

## 2016-12-26 DIAGNOSIS — R739 Hyperglycemia, unspecified: Secondary | ICD-10-CM | POA: Diagnosis not present

## 2016-12-26 DIAGNOSIS — I2119 ST elevation (STEMI) myocardial infarction involving other coronary artery of inferior wall: Secondary | ICD-10-CM | POA: Diagnosis not present

## 2016-12-26 DIAGNOSIS — Z87891 Personal history of nicotine dependence: Secondary | ICD-10-CM | POA: Diagnosis not present

## 2016-12-26 DIAGNOSIS — I2121 ST elevation (STEMI) myocardial infarction involving left circumflex coronary artery: Secondary | ICD-10-CM | POA: Diagnosis present

## 2016-12-26 DIAGNOSIS — E785 Hyperlipidemia, unspecified: Secondary | ICD-10-CM | POA: Diagnosis present

## 2016-12-26 DIAGNOSIS — Z9049 Acquired absence of other specified parts of digestive tract: Secondary | ICD-10-CM | POA: Diagnosis not present

## 2016-12-26 DIAGNOSIS — I10 Essential (primary) hypertension: Secondary | ICD-10-CM | POA: Diagnosis present

## 2016-12-26 DIAGNOSIS — I25119 Atherosclerotic heart disease of native coronary artery with unspecified angina pectoris: Secondary | ICD-10-CM | POA: Diagnosis present

## 2016-12-26 DIAGNOSIS — I252 Old myocardial infarction: Secondary | ICD-10-CM

## 2016-12-26 DIAGNOSIS — I129 Hypertensive chronic kidney disease with stage 1 through stage 4 chronic kidney disease, or unspecified chronic kidney disease: Secondary | ICD-10-CM | POA: Diagnosis present

## 2016-12-26 DIAGNOSIS — R7309 Other abnormal glucose: Secondary | ICD-10-CM | POA: Diagnosis present

## 2016-12-26 HISTORY — PX: CORONARY STENT INTERVENTION: CATH118234

## 2016-12-26 HISTORY — PX: LEFT HEART CATH AND CORONARY ANGIOGRAPHY: CATH118249

## 2016-12-26 LAB — CBC
HCT: 40.3 % (ref 40.0–52.0)
HEMOGLOBIN: 13.5 g/dL (ref 13.0–18.0)
MCH: 30.4 pg (ref 26.0–34.0)
MCHC: 33.6 g/dL (ref 32.0–36.0)
MCV: 90.6 fL (ref 80.0–100.0)
Platelets: 266 10*3/uL (ref 150–440)
RBC: 4.44 MIL/uL (ref 4.40–5.90)
RDW: 13.1 % (ref 11.5–14.5)
WBC: 8.9 10*3/uL (ref 3.8–10.6)

## 2016-12-26 LAB — BASIC METABOLIC PANEL
ANION GAP: 6 (ref 5–15)
BUN: 18 mg/dL (ref 6–20)
CHLORIDE: 106 mmol/L (ref 101–111)
CO2: 27 mmol/L (ref 22–32)
Calcium: 8.8 mg/dL — ABNORMAL LOW (ref 8.9–10.3)
Creatinine, Ser: 1.12 mg/dL (ref 0.61–1.24)
Glucose, Bld: 145 mg/dL — ABNORMAL HIGH (ref 65–99)
POTASSIUM: 4 mmol/L (ref 3.5–5.1)
SODIUM: 139 mmol/L (ref 135–145)

## 2016-12-26 LAB — TROPONIN I
TROPONIN I: 13.06 ng/mL — AB (ref <0.03)
Troponin I: <0.03 ng/mL (ref <0.03)

## 2016-12-26 LAB — PROTIME-INR
INR: 1.05
Prothrombin Time: 13.7 seconds (ref 11.4–15.2)

## 2016-12-26 LAB — CARDIAC CATHETERIZATION: CATHEFQUANT: 50 %

## 2016-12-26 LAB — GLUCOSE, CAPILLARY
GLUCOSE-CAPILLARY: 122 mg/dL — AB (ref 65–99)
Glucose-Capillary: 119 mg/dL — ABNORMAL HIGH (ref 65–99)

## 2016-12-26 LAB — APTT: aPTT: 24 seconds (ref 24–36)

## 2016-12-26 LAB — POCT ACTIVATED CLOTTING TIME
ACTIVATED CLOTTING TIME: 378 s
ACTIVATED CLOTTING TIME: 158 s

## 2016-12-26 LAB — MRSA PCR SCREENING: MRSA BY PCR: NEGATIVE

## 2016-12-26 SURGERY — LEFT HEART CATH AND CORONARY ANGIOGRAPHY

## 2016-12-26 MED ORDER — DOCUSATE SODIUM 100 MG PO CAPS
100.0000 mg | ORAL_CAPSULE | Freq: Two times a day (BID) | ORAL | Status: DC
  Administered 2016-12-27 (×2): 100 mg via ORAL
  Filled 2016-12-26 (×4): qty 1

## 2016-12-26 MED ORDER — SODIUM CHLORIDE 0.9 % IV SOLN: 250.0000 mL | INTRAVENOUS | Status: DC | PRN

## 2016-12-26 MED ORDER — ACETAMINOPHEN 325 MG PO TABS: 650.0000 mg | ORAL_TABLET | ORAL | Status: DC | PRN

## 2016-12-26 MED ORDER — HYDROCODONE-ACETAMINOPHEN 5-325 MG PO TABS: 1.0000 | ORAL_TABLET | ORAL | Status: DC | PRN

## 2016-12-26 MED ORDER — PANTOPRAZOLE SODIUM 40 MG PO TBEC
40.0000 mg | DELAYED_RELEASE_TABLET | Freq: Every day | ORAL | Status: DC
  Administered 2016-12-26 – 2016-12-28 (×3): 40 mg via ORAL
  Filled 2016-12-26 (×3): qty 1

## 2016-12-26 MED ORDER — SODIUM CHLORIDE 0.9 % WEIGHT BASED INFUSION
1.0000 mL/kg/h | INTRAVENOUS | Status: AC
  Administered 2016-12-26: 1 mL/kg/h via INTRAVENOUS

## 2016-12-26 MED ORDER — HEPARIN (PORCINE) IN NACL 2-0.9 UNIT/ML-% IJ SOLN
INTRAMUSCULAR | Status: DC | PRN
  Administered 2016-12-26: 15:00:00

## 2016-12-26 MED ORDER — BIVALIRUDIN BOLUS VIA INFUSION - CUPID
INTRAVENOUS | Status: DC | PRN
  Administered 2016-12-26: 68.4 mg via INTRAVENOUS

## 2016-12-26 MED ORDER — NITROGLYCERIN 0.4 MG SL SUBL
SUBLINGUAL_TABLET | SUBLINGUAL | Status: AC
  Filled 2016-12-26: qty 2

## 2016-12-26 MED ORDER — CLOPIDOGREL BISULFATE 75 MG PO TABS: 75.0000 mg | ORAL_TABLET | Freq: Every day | ORAL | Status: DC

## 2016-12-26 MED ORDER — ONDANSETRON HCL 4 MG PO TABS: 4.0000 mg | ORAL_TABLET | Freq: Four times a day (QID) | ORAL | Status: DC | PRN

## 2016-12-26 MED ORDER — METOPROLOL TARTRATE 5 MG/5ML IV SOLN
5.0000 mg | Freq: Once | INTRAVENOUS | Status: AC
Start: 2016-12-26 — End: 2016-12-26
  Administered 2016-12-26: 5 mg via INTRAVENOUS
  Filled 2016-12-26: qty 5

## 2016-12-26 MED ORDER — ONDANSETRON HCL 4 MG/2ML IJ SOLN: 4.0000 mg | Freq: Four times a day (QID) | INTRAMUSCULAR | Status: DC | PRN

## 2016-12-26 MED ORDER — METOPROLOL TARTRATE 25 MG PO TABS: 25.0000 mg | ORAL_TABLET | Freq: Two times a day (BID) | ORAL | Status: DC

## 2016-12-26 MED ORDER — LATANOPROST 0.005 % OP SOLN
1.0000 [drp] | Freq: Every day | OPHTHALMIC | Status: DC
  Administered 2016-12-26 – 2016-12-27 (×2): 1 [drp] via OPHTHALMIC
  Filled 2016-12-26: qty 2.5

## 2016-12-26 MED ORDER — ASPIRIN 81 MG PO CHEW: 81.0000 mg | CHEWABLE_TABLET | Freq: Every day | ORAL | Status: DC

## 2016-12-26 MED ORDER — BIVALIRUDIN 250 MG IV SOLR
INTRAVENOUS | Status: DC | PRN
  Administered 2016-12-26: 1.75 mg/kg/h via INTRAVENOUS

## 2016-12-26 MED ORDER — NITROGLYCERIN 1 MG/10 ML FOR IR/CATH LAB
INTRA_ARTERIAL | Status: DC | PRN
  Administered 2016-12-26 (×2): 200 ug via INTRACORONARY

## 2016-12-26 MED ORDER — NITROGLYCERIN 5 MG/ML IV SOLN
INTRAVENOUS | Status: AC
  Filled 2016-12-26: qty 10

## 2016-12-26 MED ORDER — CLOPIDOGREL BISULFATE 75 MG PO TABS
75.0000 mg | ORAL_TABLET | Freq: Every day | ORAL | Status: DC
  Administered 2016-12-27 – 2016-12-28 (×2): 75 mg via ORAL
  Filled 2016-12-26 (×2): qty 1

## 2016-12-26 MED ORDER — ACETAMINOPHEN 650 MG RE SUPP: 650.0000 mg | Freq: Four times a day (QID) | RECTAL | Status: DC | PRN

## 2016-12-26 MED ORDER — BIVALIRUDIN 250 MG IV SOLR
INTRAVENOUS | Status: AC
  Filled 2016-12-26: qty 250

## 2016-12-26 MED ORDER — SODIUM CHLORIDE 0.9% FLUSH
3.0000 mL | Freq: Two times a day (BID) | INTRAVENOUS | Status: DC
  Administered 2016-12-26 – 2016-12-27 (×3): 3 mL via INTRAVENOUS

## 2016-12-26 MED ORDER — SODIUM CHLORIDE 0.9% FLUSH: 3.0000 mL | INTRAVENOUS | Status: DC | PRN

## 2016-12-26 MED ORDER — LABETALOL HCL 5 MG/ML IV SOLN: 10.0000 mg | INTRAVENOUS | Status: AC | PRN

## 2016-12-26 MED ORDER — OMEGA-3-ACID ETHYL ESTERS 1 G PO CAPS
1.0000 g | ORAL_CAPSULE | Freq: Two times a day (BID) | ORAL | Status: DC
  Administered 2016-12-26 – 2016-12-28 (×4): 1 g via ORAL
  Filled 2016-12-26 (×4): qty 1

## 2016-12-26 MED ORDER — SODIUM CHLORIDE 0.9% FLUSH
3.0000 mL | Freq: Two times a day (BID) | INTRAVENOUS | Status: DC
  Administered 2016-12-26 – 2016-12-27 (×4): 3 mL via INTRAVENOUS

## 2016-12-26 MED ORDER — SODIUM CHLORIDE 0.9 % IV SOLN
0.2500 mg/kg/h | INTRAVENOUS | Status: AC
  Filled 2016-12-26: qty 250

## 2016-12-26 MED ORDER — HEPARIN SODIUM (PORCINE) 5000 UNIT/ML IJ SOLN
4000.0000 [IU] | Freq: Once | INTRAMUSCULAR | Status: AC
  Administered 2016-12-26: 4000 [IU] via INTRAVENOUS

## 2016-12-26 MED ORDER — SODIUM CHLORIDE 0.9% FLUSH
3.0000 mL | Freq: Two times a day (BID) | INTRAVENOUS | Status: DC
  Administered 2016-12-27 – 2016-12-28 (×3): 3 mL via INTRAVENOUS

## 2016-12-26 MED ORDER — INSULIN ASPART 100 UNIT/ML ~~LOC~~ SOLN
0.0000 [IU] | Freq: Three times a day (TID) | SUBCUTANEOUS | Status: DC
  Administered 2016-12-26: 1 [IU] via SUBCUTANEOUS
  Filled 2016-12-26: qty 1

## 2016-12-26 MED ORDER — CLOPIDOGREL BISULFATE 75 MG PO TABS
600.0000 mg | ORAL_TABLET | Freq: Once | ORAL | Status: AC
  Administered 2016-12-26: 600 mg via ORAL

## 2016-12-26 MED ORDER — METOPROLOL TARTRATE 25 MG PO TABS
12.5000 mg | ORAL_TABLET | Freq: Four times a day (QID) | ORAL | Status: DC
  Administered 2016-12-26 – 2016-12-27 (×3): 12.5 mg via ORAL
  Filled 2016-12-26 (×3): qty 1

## 2016-12-26 MED ORDER — LOSARTAN POTASSIUM 50 MG PO TABS
100.0000 mg | ORAL_TABLET | Freq: Every day | ORAL | Status: DC
  Administered 2016-12-27 – 2016-12-28 (×2): 100 mg via ORAL
  Filled 2016-12-26 (×2): qty 2

## 2016-12-26 MED ORDER — IOPAMIDOL (ISOVUE-300) INJECTION 61%
INTRAVENOUS | Status: DC | PRN
  Administered 2016-12-26: 260 mL

## 2016-12-26 MED ORDER — ACETAMINOPHEN 325 MG PO TABS: 650.0000 mg | ORAL_TABLET | Freq: Four times a day (QID) | ORAL | Status: DC | PRN

## 2016-12-26 MED ORDER — ADULT MULTIVITAMIN W/MINERALS CH
1.0000 | ORAL_TABLET | Freq: Every day | ORAL | Status: DC
  Administered 2016-12-27 – 2016-12-28 (×2): 1 via ORAL
  Filled 2016-12-26 (×2): qty 1

## 2016-12-26 MED ORDER — ATORVASTATIN CALCIUM 10 MG PO TABS
10.0000 mg | ORAL_TABLET | Freq: Every day | ORAL | Status: DC
  Administered 2016-12-26: 10 mg via ORAL
  Filled 2016-12-26: qty 1

## 2016-12-26 MED ORDER — ASPIRIN EC 81 MG PO TBEC
81.0000 mg | DELAYED_RELEASE_TABLET | Freq: Every day | ORAL | Status: DC
  Administered 2016-12-27 – 2016-12-28 (×2): 81 mg via ORAL
  Filled 2016-12-26 (×2): qty 1

## 2016-12-26 MED ORDER — HYDRALAZINE HCL 20 MG/ML IJ SOLN: 5.0000 mg | INTRAMUSCULAR | Status: AC | PRN

## 2016-12-26 MED ORDER — BISACODYL 10 MG RE SUPP: 10.0000 mg | Freq: Every day | RECTAL | Status: DC | PRN

## 2016-12-26 SURGICAL SUPPLY — 16 items
BALLN TREK RX 2.25X15 (BALLOONS) ×3
BALLOON TREK RX 2.25X15 (BALLOONS) ×1 IMPLANT
CATH 5FR PIGTAIL DIAGNOSTIC (CATHETERS) ×3 IMPLANT
CATH INFINITI 5FR JL4 (CATHETERS) ×3 IMPLANT
CATH INFINITI JR4 5F (CATHETERS) ×3 IMPLANT
CATH VISTA GUIDE 6FR XB3.5 (CATHETERS) ×3 IMPLANT
DEVICE INFLAT 30 PLUS (MISCELLANEOUS) ×3 IMPLANT
KIT MANI 3VAL PERCEP (MISCELLANEOUS) ×3 IMPLANT
NEEDLE PERC 18GX7CM (NEEDLE) ×6 IMPLANT
PACK CARDIAC CATH (CUSTOM PROCEDURE TRAY) ×3 IMPLANT
PROTECTION STATION PRESSURIZED (MISCELLANEOUS) ×3
SHEATH AVANTI 6FR X 11CM (SHEATH) ×3 IMPLANT
STATION PROTECTION PRESSURIZED (MISCELLANEOUS) ×1 IMPLANT
STENT XIENCE ALPINE RX 2.25X28 (Permanent Stent) ×3 IMPLANT
WIRE EMERALD 3MM-J .035X150CM (WIRE) ×3 IMPLANT
WIRE G HI TQ BMW 190 (WIRE) ×3 IMPLANT
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
## 2016-12-26 NOTE — ED Notes (Signed)
Cardiologist paged at 13:21. Cardiologist paged at 13:32. Cardiologist arrived to bedside at 13:37.

## 2016-12-26 NOTE — Progress Notes (Signed)
Patient's shealth pulled.  Site soft and no bleeding or bruising.  Patient resting- vitals stable.
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
## 2016-12-26 NOTE — ED Triage Notes (Addendum)
Pt arrived to ED by EMS with c/o of dizziness. Pt was bathing his dog and upon standing felt dizzy, became diaphoretic, SOB and pale. Pt had some chest pain that has decreased with 1 nitro and 324 of aspirin in route. EMS attempted to do orthostatic vitals, pt became too dizzy to obtain.

## 2016-12-26 NOTE — Progress Notes (Signed)
Notified Dr. Clayborn Bigness of patient having multiple runs- 9-10 beats of PVCs- then back to sinus rhythm.  HR 72 and BP 125/81. Dr. Clayborn Bigness ordered for 5mg  IV metoprolol now and metoprolol 25mg  oral Q6 to give now as well.
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
## 2016-12-26 NOTE — Progress Notes (Signed)
ACT 158

## 2016-12-27 LAB — TROPONIN I
Troponin I: >65 ng/mL (ref <0.03)
Troponin I: 41.06 ng/mL (ref <0.03)

## 2016-12-27 LAB — COMPREHENSIVE METABOLIC PANEL
ALBUMIN: 3.4 g/dL — AB (ref 3.5–5.0)
ALK PHOS: 65 U/L (ref 38–126)
ALT: 54 U/L (ref 17–63)
ANION GAP: 5 (ref 5–15)
AST: 153 U/L — ABNORMAL HIGH (ref 15–41)
BUN: 15 mg/dL (ref 6–20)
CALCIUM: 8.4 mg/dL — AB (ref 8.9–10.3)
CO2: 26 mmol/L (ref 22–32)
Chloride: 108 mmol/L (ref 101–111)
Creatinine, Ser: 0.91 mg/dL (ref 0.61–1.24)
GFR calc Af Amer: >60 mL/min (ref >60)
GFR calc non Af Amer: >60 mL/min (ref >60)
GLUCOSE: 103 mg/dL — AB (ref 65–99)
POTASSIUM: 3.8 mmol/L (ref 3.5–5.1)
SODIUM: 139 mmol/L (ref 135–145)
Total Bilirubin: 0.9 mg/dL (ref 0.3–1.2)
Total Protein: 6.5 g/dL (ref 6.5–8.1)

## 2016-12-27 LAB — LIPID PANEL
CHOL/HDL RATIO: 3.1 ratio
Cholesterol: 91 mg/dL (ref 0–200)
HDL: 29 mg/dL — ABNORMAL LOW (ref >40)
LDL Cholesterol: 39 mg/dL (ref 0–99)
Triglycerides: 117 mg/dL (ref <150)
VLDL: 23 mg/dL (ref 0–40)

## 2016-12-27 LAB — CBC
HCT: 36.6 % — ABNORMAL LOW (ref 40.0–52.0)
Hemoglobin: 12.7 g/dL — ABNORMAL LOW (ref 13.0–18.0)
MCH: 30.9 pg (ref 26.0–34.0)
MCHC: 34.7 g/dL (ref 32.0–36.0)
MCV: 89.1 fL (ref 80.0–100.0)
PLATELETS: 215 10*3/uL (ref 150–440)
RBC: 4.11 MIL/uL — AB (ref 4.40–5.90)
RDW: 13 % (ref 11.5–14.5)
WBC: 8.1 10*3/uL (ref 3.8–10.6)

## 2016-12-27 LAB — GLUCOSE, CAPILLARY
GLUCOSE-CAPILLARY: 98 mg/dL (ref 65–99)
GLUCOSE-CAPILLARY: 86 mg/dL (ref 65–99)
Glucose-Capillary: 96 mg/dL (ref 65–99)
Glucose-Capillary: 130 mg/dL — ABNORMAL HIGH (ref 65–99)

## 2016-12-27 MED ORDER — ATORVASTATIN CALCIUM 20 MG PO TABS
80.0000 mg | ORAL_TABLET | Freq: Every day | ORAL | Status: DC
  Administered 2016-12-27: 80 mg via ORAL
  Filled 2016-12-27: qty 4

## 2016-12-27 MED ORDER — METOPROLOL TARTRATE 25 MG PO TABS
25.0000 mg | ORAL_TABLET | Freq: Two times a day (BID) | ORAL | Status: DC
  Filled 2016-12-27: qty 1

## 2016-12-27 NOTE — Progress Notes (Signed)
Pt being transferred to room 258. Report called to Richardson Landry, RN. Pt and belongings transferred to room 258 without incident.

## 2016-12-27 NOTE — Progress Notes (Signed)
Subjective:  Patient feels reasonably well denies any significant chest pain since PCI and stent no shortness of breath ambulating well no groin issues  Objective:  Vital Signs in the last 24 hours: Temp:  [97.4 F (36.3 C)-98.7 F (37.1 C)] 97.4 F (36.3 C) (02/18 1036) Pulse Rate:  [56-78] 62 (02/18 1036) Resp:  [15-31] 18 (02/18 1036) BP: (110-151)/(66-103) 145/68 (02/18 1036) SpO2:  [94 %-99 %] 96 % (02/18 1036) Weight:  [93.2 kg (205 lb 6.4 oz)-94.8 kg (209 lb)] 93.2 kg (205 lb 6.4 oz) (02/18 0500)  Intake/Output from previous day: 02/17 0701 - 02/18 0700 In: 1461.6 [P.O.:480; I.V.:981.6] Out: 950 [Urine:950] Intake/Output from this shift: Total I/O In: 600 [P.O.:600] Out: -   Physical Exam: General appearance: appears stated age Neck: no adenopathy, no carotid bruit, no JVD, supple, symmetrical, trachea midline and thyroid not enlarged, symmetric, no tenderness/mass/nodules Lungs: clear to auscultation bilaterally Heart: regular rate and rhythm, S1, S2 normal, no murmur, click, rub or gallop Abdomen: soft, non-tender; bowel sounds normal; no masses,  no organomegaly Extremities: extremities normal, atraumatic, no cyanosis or edema Pulses: 2+ and symmetric Skin: Skin color, texture, turgor normal. No rashes or lesions Neurologic: Alert and oriented X 3, normal strength and tone. Normal symmetric reflexes. Normal coordination and gait  Lab Results:  Recent Labs  12/26/16 1322 12/27/16 0309  WBC 8.9 8.1  HGB 13.5 12.7*  PLT 266 215    Recent Labs  12/26/16 1350 12/27/16 0309  NA 139 139  K 4.0 3.8  CL 106 108  CO2 27 26  GLUCOSE 145* 103*  BUN 18 15  CREATININE 1.12 0.91    Recent Labs  12/27/16 0309 12/27/16 0848  TROPONINI >65.00* 41.06*   Hepatic Function Panel  Recent Labs  12/27/16 0309  PROT 6.5  ALBUMIN 3.4*  AST 153*  ALT 54  ALKPHOS 65  BILITOT 0.9    Recent Labs  12/27/16 0309  CHOL 91   No results for input(s): PROTIME  in the last 72 hours.  Imaging: Imaging results have been reviewed  Cardiac Studies:  Assessment/Plan:  Angina Chest Pain Coronary Artery Disease Coronary Artery Stent Shortness of Breath  Status post STEMI inferior wall Status post PCI and stent with DES to circumflex Hyperlipidemia Elevated troponins Hypertension . PLAN Continue telemetry Continue activity with ambulation in the floor Agree with blood pressure control with losartan and we've added metoprolol Continue aspirin Plavix status post DES stent placement Continue Lipitor therapy was started 80 mg and angina as outpatient consider reducing Protonix therapy for reflux symptoms Patient appears to be ready for discharge in the morning Follow-up with cardiology one to 2 weeks No heavy lifting for at least 1-2 weeks Consider staging LAD lesion for PCI and stent   LOS: 1 day    Sani Madariaga D Ercell Razon 12/27/2016, 3:06 PM

## 2016-12-27 NOTE — Progress Notes (Signed)
[facility] at [place] NAME: Jason Craig    MR#:  [id number]  DATE OF BIRTH:  04-21-1941  SUBJECTIVE:  CHIEF COMPLAINT:   Chief Complaint  Patient presents with  . Dizziness    K with chest pain, found to have ST elevation MI, taken for cardiac catheterization and drug-eluting stent was placed in distal circumflex artery. Also have some blockages in the mid and distal LAD. Ejection fraction 50-55%. Physical data today, no chest pain. REVIEW OF SYSTEMS:  CONSTITUTIONAL: No fever, fatigue or weakness.  EYES: No blurred or double vision.  EARS, NOSE, AND THROAT: No tinnitus or ear pain.  RESPIRATORY: No cough, shortness of breath, wheezing or hemoptysis.  CARDIOVASCULAR: No chest pain, orthopnea, edema.  GASTROINTESTINAL: No nausea, vomiting, diarrhea or abdominal pain.  GENITOURINARY: No dysuria, hematuria.  ENDOCRINE: No polyuria, nocturia,  HEMATOLOGY: No anemia, easy bruising or bleeding SKIN: No rash or lesion. MUSCULOSKELETAL: No joint pain or arthritis.   NEUROLOGIC: No tingling, numbness, weakness.  PSYCHIATRY: No anxiety or depression.   ROS  DRUG ALLERGIES:  No Known Allergies  VITALS:  Blood pressure (!) 145/68, pulse 62, temperature 97.4 F (36.3 C), temperature source Oral, resp. rate 18, height 6' (1.829 m), weight 93.2 kg (205 lb 6.4 oz), SpO2 96 %.  PHYSICAL EXAMINATION:  GENERAL:  76 y.o.-year-old patient lying in the bed with no acute distress.  EYES: Pupils equal, round, reactive to light and accommodation. No scleral icterus. Extraocular muscles intact.  HEENT: Head atraumatic, normocephalic. Oropharynx and nasopharynx clear.  NECK:  Supple, no jugular venous distention. No thyroid enlargement, no tenderness.  LUNGS: Normal breath sounds bilaterally, no wheezing, rales,rhonchi or crepitation. No use of accessory muscles of respiration.  CARDIOVASCULAR: S1, S2 normal. No murmurs, rubs, or gallops.  ABDOMEN: Soft,  nontender, nondistended. Bowel sounds present. No organomegaly or mass.  EXTREMITIES: No pedal edema, cyanosis, or clubbing.  NEUROLOGIC: Cranial nerves II through XII are intact. Muscle strength 5/5 in all extremities. Sensation intact. Gait not checked.  PSYCHIATRIC: The patient is alert and oriented x 3.  SKIN: No obvious rash, lesion, or ulcer.   Physical Exam LABORATORY PANEL:   CBC  Recent Labs Lab 12/27/16 0309  WBC 8.1  HGB 12.7*  HCT 36.6*  PLT 215   ------------------------------------------------------------------------------------------------------------------  Chemistries   Recent Labs Lab 12/27/16 0309  NA 139  K 3.8  CL 108  CO2 26  GLUCOSE 103*  BUN 15  CREATININE 0.91  CALCIUM 8.4*  AST 153*  ALT 54  ALKPHOS 65  BILITOT 0.9   ------------------------------------------------------------------------------------------------------------------  Cardiac Enzymes  Recent Labs Lab 12/27/16 0309 12/27/16 0848  TROPONINI >65.00* 41.06*   ------------------------------------------------------------------------------------------------------------------  RADIOLOGY:  No results found.  ASSESSMENT AND PLAN:   Principal Problem:   ST elevation myocardial infarction (STEMI) ([place]) Active Problems:   BP (high blood pressure)   Blood glucose elevated   Chest pain   STEMI (ST elevation myocardial infarction) ([facility])   Acute ST elevation myocardial infarction (STEMI) involving left circumflex coronary artery ([facility])   * ST elevation MI   Status post catheterization and drug-eluting stent placement in distal circumflex artery   Continue aspirin, Plavix, statin, beta blocker.   Further management as per cardiology, possible discharge tomorrow.   May need echocardiogram as outpatient.  * Hypertension   Continue losartan and metoprolol.    All the records are reviewed and case discussed with Care Management/Social Workerr. Management plans discussed  with the patient,  family and they are in agreement.  CODE STATUS: Full  TOTAL TIME TAKING CARE OF THIS PATIENT: 35 minutes.     POSSIBLE D/C IN 1-2 DAYS, DEPENDING ON CLINICAL CONDITION.   Vaughan Basta M.D on 12/27/2016   Between 7am to 6pm - Pager - [phone]  After 6pm go to [email or web address] - password [email or web address]  Office  [phone]  CC: Primary care physician; Dicky Doe, MD  Note: This dictation was prepared with Dragon dictation along with smaller phrase technology. Any transcriptional errors that result from this process are unintentional.

## 2016-12-28 ENCOUNTER — Encounter: Payer: Self-pay | Admitting: Internal Medicine

## 2016-12-28 LAB — HEMOGLOBIN A1C
HEMOGLOBIN A1C: 5.7 % — AB (ref 4.8–5.6)
MEAN PLASMA GLUCOSE: 117 mg/dL

## 2016-12-28 LAB — GLUCOSE, CAPILLARY
Glucose-Capillary: 111 mg/dL — ABNORMAL HIGH (ref 65–99)
Glucose-Capillary: 83 mg/dL (ref 65–99)

## 2016-12-28 MED ORDER — ATORVASTATIN CALCIUM 40 MG PO TABS: 40.0000 mg | ORAL_TABLET | Freq: Every day | ORAL | 0 refills | Status: DC

## 2016-12-28 MED ORDER — CLOPIDOGREL BISULFATE 75 MG PO TABS: 75.0000 mg | ORAL_TABLET | Freq: Every day | ORAL | 0 refills | Status: DC

## 2016-12-28 MED ORDER — ASPIRIN 81 MG PO TBEC: 81.0000 mg | DELAYED_RELEASE_TABLET | Freq: Every day | ORAL | 0 refills | Status: AC

## 2016-12-28 MED ORDER — METOPROLOL TARTRATE 50 MG PO TABS: 25.0000 mg | ORAL_TABLET | Freq: Two times a day (BID) | ORAL | 0 refills | Status: DC

## 2016-12-28 NOTE — Progress Notes (Signed)
Pt. Slept well throughout the night with no c/o pain, SOB or acute distress noted. Running NSR throughout the night in 60's-70's.

## 2016-12-28 NOTE — Progress Notes (Signed)
Patient discharged home as ordered,instructions explained and well understood,follow up appointment made,prescriptions given,escorted by spouse and staff member via wheelchair

## 2016-12-28 NOTE — Care Management (Signed)
Met with with patient and his wife. He is independent with daily activities. PCP he states is Dr. Clayborn Bigness. He denies problems paying for medications and hopes to discharge to home today. No RNCM needs.

## 2016-12-28 NOTE — Progress Notes (Signed)
Pt. Ambulated around nurses station 5 times with no SOB or acute distress noted. Pt. On room air.
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
## 2016-12-29 NOTE — Progress Notes (Signed)
Subjective:  Patient states to feel reasonably well denies any chest pain right groin is stable no shortness of breath ambulating well in the halls without difficulty  Objective:  Vital Signs in the last 24 hours: Temp:  [98 F (36.7 C)] 98 F (36.7 C) (02/19 1202) Pulse Rate:  [79] 79 (02/19 1202) Resp:  [20] 20 (02/19 1202) BP: (127)/(67) 127/67 (02/19 1202) SpO2:  [94 %] 94 % (02/19 1202)  Intake/Output from previous day: 02/19 0701 - 02/20 0700 In: 240 [P.O.:240] Out: -  Intake/Output from this shift: Total I/O In: 840 [P.O.:840] Out: 100 [Urine:100]  Physical Exam: General appearance: appears stated age Neck: no adenopathy, no carotid bruit, no JVD, supple, symmetrical, trachea midline and thyroid not enlarged, symmetric, no tenderness/mass/nodules Lungs: clear to auscultation bilaterally Heart: regular rate and rhythm, S1, S2 normal, no murmur, click, rub or gallop Abdomen: soft, non-tender; bowel sounds normal; no masses,  no organomegaly Extremities: extremities normal, atraumatic, no cyanosis or edema Pulses: 2+ and symmetric Skin: Skin color, texture, turgor normal. No rashes or lesions Neurologic: Alert and oriented X 3, normal strength and tone. Normal symmetric reflexes. Normal coordination and gait  Lab Results:  Recent Labs  12/26/16 1322 12/27/16 0309  WBC 8.9 8.1  HGB 13.5 12.7*  PLT 266 215    Recent Labs  12/26/16 1350 12/27/16 0309  NA 139 139  K 4.0 3.8  CL 106 108  CO2 27 26  GLUCOSE 145* 103*  BUN 18 15  CREATININE 1.12 0.91    Recent Labs  12/27/16 0309 12/27/16 0848  TROPONINI >65.00* 41.06*   Hepatic Function Panel  Recent Labs  12/27/16 0309  PROT 6.5  ALBUMIN 3.4*  AST 153*  ALT 54  ALKPHOS 65  BILITOT 0.9    Recent Labs  12/27/16 0309  CHOL 91   No results for input(s): PROTIME in the last 72 hours.  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 Plan Continue aspirin Plavix for at least 12 months Continue Lipitor therapy for lipid management Agree with blood pressure control with losartan and metoprolol Post-MI management with losartan and metoprolol aspirin Plavix and statin Continue Protonix therapy for reflux symptoms Increase activity ambulate Recommend cardiac rehabilitation Medications on discharge be aspirin 81 mg a day Plavix 75 mg a day losartan 50 metoprolol 25 daily and Lipitor 40 Have the patient follow-up with cardiology one to 2 weeks Patient stable for discharge today  LOS: 2 days    Jason Craig 12/29/2016, 8:50 AM

## 2017-01-04 DIAGNOSIS — I228 Subsequent ST elevation (STEMI) myocardial infarction of other sites: Secondary | ICD-10-CM | POA: Diagnosis not present

## 2017-01-04 DIAGNOSIS — I25118 Atherosclerotic heart disease of native coronary artery with other forms of angina pectoris: Secondary | ICD-10-CM | POA: Diagnosis not present

## 2017-01-04 DIAGNOSIS — I208 Other forms of angina pectoris: Secondary | ICD-10-CM | POA: Diagnosis not present

## 2017-01-04 DIAGNOSIS — R011 Cardiac murmur, unspecified: Secondary | ICD-10-CM | POA: Diagnosis not present

## 2017-01-04 DIAGNOSIS — E785 Hyperlipidemia, unspecified: Secondary | ICD-10-CM | POA: Diagnosis not present

## 2017-01-25 ENCOUNTER — Encounter: Payer: Medicare Other | Attending: Internal Medicine | Admitting: *Deleted

## 2017-01-25 VITALS — Ht 72.3 in | Wt 197.1 lb

## 2017-01-25 DIAGNOSIS — I213 ST elevation (STEMI) myocardial infarction of unspecified site: Secondary | ICD-10-CM | POA: Insufficient documentation

## 2017-01-25 DIAGNOSIS — I2121 ST elevation (STEMI) myocardial infarction involving left circumflex coronary artery: Secondary | ICD-10-CM

## 2017-01-25 NOTE — Patient Instructions (Signed)
Patient Instructions  Patient Details  Name: Jason Craig MRN: [id number] Date of Birth: 12/08/1940 Referring Provider:  Yolonda Kida, MD  Below are the personal goals you chose as well as exercise and nutrition goals. Our goal is to help you keep on track towards obtaining and maintaining your goals. We will be discussing your progress on these goals with you throughout the program.  Initial Exercise Prescription:     Initial Exercise Prescription - 01/25/17 1400      Date of Initial Exercise RX and Referring Provider   Date 01/25/17   Referring Provider Lujean Amel MD     Treadmill   MPH 3.5   Grade 0.5   Minutes 15   METs 3.68     NuStep   Level 3   SPM 100   Minutes 15   METs 3     REL-XR   Level 3   Speed 50   Minutes 15   METs 3     Prescription Details   Frequency (times per week) 3   Duration Progress to 45 minutes of aerobic exercise without signs/symptoms of physical distress     Intensity   THRR 40-80% of Max Heartrate 90-125   Ratings of Perceived Exertion 11-13   Perceived Dyspnea 0-4     Progression   Progression Continue to progress workloads to maintain intensity without signs/symptoms of physical distress.     Resistance Training   Training Prescription Yes   Weight 3 lbs      Exercise Goals: Frequency: Be able to perform aerobic exercise three times per week working toward 3-5 days per week.  Intensity: Work with a perceived exertion of 11 (fairly light) - 15 (hard) as tolerated. Follow your new exercise prescription and watch for changes in prescription as you progress with the program. Changes will be reviewed with you when they are made.  Duration: You should be able to do 30 minutes of continuous aerobic exercise in addition to a 5 minute warm-up and a 5 minute cool-down routine.  Nutrition Goals: Your personal nutrition goals will be established when you do your nutrition analysis with the dietician.  The following are  nutrition guidelines to follow: Cholesterol < 200mg /day Sodium < 1500mg /day Fiber: Men over 50 yrs - 30 grams per day  Personal Goals:     Personal Goals and Risk Factors at Admission - 01/25/17 1318      Core Components/Risk Factors/Patient Goals on Admission    Weight Management Weight Loss;Yes   Intervention Weight Management: Develop a combined nutrition and exercise program designed to reach desired caloric intake, while maintaining appropriate intake of nutrient and fiber, sodium and fats, and appropriate energy expenditure required for the weight goal.;Weight Management: Provide education and appropriate resources to help participant work on and attain dietary goals.   Admit Weight 197 lb (89.4 kg)   Goal Weight: Short Term 195 lb (88.5 kg)   Goal Weight: Long Term 190 lb (86.2 kg)   Expected Outcomes Short Term: Continue to assess and modify interventions until short term weight is achieved;Weight Loss: Understanding of general recommendations for a balanced deficit meal plan, which promotes 1-2 lb weight loss per week and includes a negative energy balance of [phone] kcal/d;Understanding recommendations for meals to include 15-35% energy as protein, 25-35% energy from fat, 35-60% energy from carbohydrates, less than 200mg  of dietary cholesterol, 20-35 gm of total fiber daily;Understanding of distribution of calorie intake throughout the day with the consumption of 4-5 meals/snacks;Long  Term: Adherence to nutrition and physical activity/exercise program aimed toward attainment of established weight goal   Hypertension Yes   Intervention Provide education on lifestyle modifcations including regular physical activity/exercise, weight management, moderate sodium restriction and increased consumption of fresh fruit, vegetables, and low fat dairy, alcohol moderation, and smoking cessation.;Monitor prescription use compliance.   Expected Outcomes Short Term: Continued assessment and  intervention until BP is < 140/84mm HG in hypertensive participants. < 130/14mm HG in hypertensive participants with diabetes, heart failure or chronic kidney disease.;Long Term: Maintenance of blood pressure at goal levels.   Lipids Yes   Intervention Provide education and support for participant on nutrition & aerobic/resistive exercise along with prescribed medications to achieve LDL 70mg , HDL >40mg .   Expected Outcomes Short Term: Participant states understanding of desired cholesterol values and is compliant with medications prescribed. Participant is following exercise prescription and nutrition guidelines.;Long Term: Cholesterol controlled with medications as prescribed, with individualized exercise RX and with personalized nutrition plan. Value goals: LDL < 70mg , HDL > 40 mg.      Tobacco Use Initial Evaluation: History  Smoking Status  . Former Smoker  . Types: Cigarettes  . Quit date: 11/25/1983  Smokeless Tobacco  . Never Used    Copy of goals given to participant.

## 2017-01-25 NOTE — Progress Notes (Signed)
Daily Session Note  Patient Details  Name: ROBT OKUDA MRN: [id number] Date of Birth: 02-May-1941 Referring Provider:    Encounter Date: 01/25/2017  Check In:      History  Smoking Status  . Former Smoker  . Types: Cigarettes  . Quit date: 11/25/1983  Smokeless Tobacco  . Never Used    Goals Met:  Proper associated with RPD/PD & O2 Sat Exercise tolerated well  Personal goals reviewed today.  Goals Unmet:  Not Applicable  Comments:     Dr. Emily Filbert is Medical Director for [facility].
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
## 2017-01-29 DIAGNOSIS — I2121 ST elevation (STEMI) myocardial infarction involving left circumflex coronary artery: Secondary | ICD-10-CM

## 2017-01-29 DIAGNOSIS — I213 ST elevation (STEMI) myocardial infarction of unspecified site: Secondary | ICD-10-CM | POA: Diagnosis not present

## 2017-01-29 NOTE — Progress Notes (Signed)
Daily Session Note  Patient Details  Name: Jason Craig MRN: [id number] Date of Birth: September 22, 1941 Referring Provider:     Cardiac Rehab from 01/25/2017 in [facility] Cardiac and Pulmonary Rehab  Referring Provider  Lujean Amel MD      Encounter Date: 01/29/2017  Check In:     Session Check In - 01/29/17 0930      Check-In   Location [facility]-Cardiac & Pulmonary Rehab   Staff Present Nyoka Cowden, RN, BSN, Francene Boyers, DPT, CEEA  Darel Hong RN BSN   Supervising physician immediately available to respond to emergencies See telemetry face sheet for immediately available ER MD   Medication changes reported     No   Fall or balance concerns reported    No   Tobacco Cessation No Change   Warm-up and Cool-down Performed on first and last piece of equipment   Resistance Training Performed Yes   VAD Patient? No     Pain Assessment   Currently in Pain? No/denies   Multiple Pain Sites No         History  Smoking Status  . Former Smoker  . Types: Cigarettes  . Quit date: 11/25/1983  Smokeless Tobacco  . Never Used    Goals Met:  Exercise tolerated well  Goals Unmet:  Not Applicable  Comments: First day of exercise! Patient was oriented to the gym and the equipment functions and settings. Procedures and policies of the gym were outlined and explained. The patient's individual exercise prescription and treatment plan were reviewed with them. All starting workloads were established based on the results of the functional testing  done at the initial intake visit. The plan for exercise progression was also introduced and progression will be customized based on the patient's performance and goals.    Dr. Emily Filbert is Medical Director for [place] and [facility].

## 2017-02-01 ENCOUNTER — Encounter: Payer: Medicare Other | Admitting: *Deleted

## 2017-02-01 DIAGNOSIS — I213 ST elevation (STEMI) myocardial infarction of unspecified site: Secondary | ICD-10-CM | POA: Diagnosis not present

## 2017-02-01 DIAGNOSIS — I2121 ST elevation (STEMI) myocardial infarction involving left circumflex coronary artery: Secondary | ICD-10-CM

## 2017-02-01 NOTE — Progress Notes (Signed)
Daily Session Note  Patient Details  Name: Jason Craig MRN: [id number] Date of Birth: May 18, 1941 Referring Provider:     Cardiac Rehab from 01/25/2017 in [facility] Cardiac and Pulmonary Rehab  Referring Provider  Lujean Amel MD      Encounter Date: 02/01/2017  Check In:     Session Check In - 02/01/17 0755      Check-In   Location [facility]-Cardiac & Pulmonary Rehab   Staff Present Alberteen Sam, MA, ACSM RCEP, Exercise Physiologist;Kimberleigh Mehan Amedeo Plenty, BS, ACSM CEP, Exercise Physiologist;Carroll Enterkin, RN, BSN   Supervising physician immediately available to respond to emergencies See telemetry face sheet for immediately available ER MD   Medication changes reported     No   Fall or balance concerns reported    No   Warm-up and Cool-down Performed on first and last piece of equipment   Resistance Training Performed Yes   VAD Patient? No     Pain Assessment   Currently in Pain? No/denies   Multiple Pain Sites No         History  Smoking Status  . Former Smoker  . Types: Cigarettes  . Quit date: 11/25/1983  Smokeless Tobacco  . Never Used    Goals Met:  Independence with exercise equipment Exercise tolerated well No report of cardiac concerns or symptoms Strength training completed today  Goals Unmet:  Not Applicable  Comments: Pt able to follow exercise prescription today without complaint.  Will continue to monitor for progression.    Dr. Emily Filbert is Medical Director for [place] and [facility].

## 2017-02-02 DIAGNOSIS — R011 Cardiac murmur, unspecified: Secondary | ICD-10-CM | POA: Diagnosis not present

## 2017-02-02 DIAGNOSIS — I208 Other forms of angina pectoris: Secondary | ICD-10-CM | POA: Diagnosis not present

## 2017-02-03 ENCOUNTER — Encounter: Payer: Medicare Other | Admitting: *Deleted

## 2017-02-03 ENCOUNTER — Encounter: Payer: Self-pay | Admitting: *Deleted

## 2017-02-03 DIAGNOSIS — I213 ST elevation (STEMI) myocardial infarction of unspecified site: Secondary | ICD-10-CM | POA: Diagnosis not present

## 2017-02-03 DIAGNOSIS — I2121 ST elevation (STEMI) myocardial infarction involving left circumflex coronary artery: Secondary | ICD-10-CM
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
## 2017-02-03 NOTE — Progress Notes (Signed)
Daily Session Note  Patient Details  Name: Jason Craig MRN: [id number] Date of Birth: 07/01/1941 Referring Provider:     Cardiac Rehab from 01/25/2017 in [facility] Cardiac and Pulmonary Rehab  Referring Provider  Callwood, Dwayne MD      Encounter Date: 02/03/2017  Check In:     Session Check In - 02/03/17 0914      Check-In   Location [facility]-Cardiac & Pulmonary Rehab   Staff Present Jessica Hawkins, MA, ACSM RCEP, Exercise Physiologist;Amanda Sommer, BA, ACSM CEP, Exercise Physiologist;Susanne Bice, RN, BSN, CCRP   Supervising physician immediately available to respond to emergencies See telemetry face sheet for immediately available ER MD   Medication changes reported     No   Fall or balance concerns reported    No   Warm-up and Cool-down Performed on first and last piece of equipment   Resistance Training Performed Yes   VAD Patient? No     Pain Assessment   Currently in Pain? No/denies   Multiple Pain Sites No         History  Smoking Status  . Former Smoker  . Types: Cigarettes  . Quit date: 11/25/1983  Smokeless Tobacco  . Never Used    Goals Met:  Independence with exercise equipment Exercise tolerated well No report of cardiac concerns or symptoms Strength training completed today  Goals Unmet:  Not Applicable  Comments: Pt able to follow exercise prescription today without complaint.  Will continue to monitor for progression.    Dr. Mark Miller is Medical Director for [facility] and [facility]. 

## 2017-02-04 ENCOUNTER — Encounter: Payer: Self-pay | Admitting: Dietician

## 2017-02-05 ENCOUNTER — Encounter: Payer: Medicare Other | Admitting: *Deleted

## 2017-02-05 DIAGNOSIS — I2121 ST elevation (STEMI) myocardial infarction involving left circumflex coronary artery: Secondary | ICD-10-CM

## 2017-02-05 DIAGNOSIS — I213 ST elevation (STEMI) myocardial infarction of unspecified site: Secondary | ICD-10-CM | POA: Diagnosis not present

## 2017-02-05 NOTE — Progress Notes (Signed)
Daily Session Note  Patient Details  Name: Jason Craig MRN: [id number] Date of Birth: 08-26-1941 Referring Provider:     Cardiac Rehab from 01/25/2017 in [facility] Cardiac and Pulmonary Rehab  Referring Provider  Lujean Amel MD      Encounter Date: 02/05/2017  Check In:     Session Check In - 02/05/17 0905      Check-In   Location [facility]-Cardiac & Pulmonary Rehab   Staff Present Gerlene Burdock, RN, Levie Heritage, MA, ACSM RCEP, Exercise Physiologist;Susanne Bice, RN, BSN, CCRP   Supervising physician immediately available to respond to emergencies See telemetry face sheet for immediately available ER MD   Medication changes reported     No   Fall or balance concerns reported    No   Warm-up and Cool-down Performed on first and last piece of equipment   Resistance Training Performed Yes   VAD Patient? No     Pain Assessment   Currently in Pain? No/denies         History  Smoking Status  . Former Smoker  . Types: Cigarettes  . Quit date: 11/25/1983  Smokeless Tobacco  . Never Used    Goals Met:  Proper associated with RPD/PD & O2 Sat Exercise tolerated well No report of cardiac concerns or symptoms  Goals Unmet:  Not Applicable  Comments:     Dr. Emily Filbert is Medical Director for [place] and [facility].

## 2017-02-08 ENCOUNTER — Encounter: Payer: Medicare Other | Attending: Internal Medicine | Admitting: *Deleted

## 2017-02-08 DIAGNOSIS — I25118 Atherosclerotic heart disease of native coronary artery with other forms of angina pectoris: Secondary | ICD-10-CM | POA: Diagnosis not present

## 2017-02-08 DIAGNOSIS — I208 Other forms of angina pectoris: Secondary | ICD-10-CM | POA: Diagnosis not present

## 2017-02-08 DIAGNOSIS — E785 Hyperlipidemia, unspecified: Secondary | ICD-10-CM | POA: Diagnosis not present

## 2017-02-08 DIAGNOSIS — I213 ST elevation (STEMI) myocardial infarction of unspecified site: Secondary | ICD-10-CM | POA: Diagnosis not present

## 2017-02-08 DIAGNOSIS — I2121 ST elevation (STEMI) myocardial infarction involving left circumflex coronary artery: Secondary | ICD-10-CM

## 2017-02-08 DIAGNOSIS — R011 Cardiac murmur, unspecified: Secondary | ICD-10-CM | POA: Diagnosis not present

## 2017-02-08 DIAGNOSIS — I228 Subsequent ST elevation (STEMI) myocardial infarction of other sites: Secondary | ICD-10-CM | POA: Diagnosis not present

## 2017-02-08 NOTE — Progress Notes (Addendum)
Daily Session Note  Patient Details  Name: Jason Craig MRN: [id number] Date of Birth: 1941-11-09 Referring Provider:     Cardiac Rehab from 01/25/2017 in [facility] Cardiac and Pulmonary Rehab  Referring Provider  Lujean Amel MD      Encounter Date: 02/08/2017  Check In:     Session Check In - 02/08/17 0755      Check-In   Location [facility]-Cardiac & Pulmonary Rehab   Staff Present Alberteen Sam, MA, ACSM RCEP, Exercise Physiologist;Kelly Amedeo Plenty, BS, ACSM CEP, Exercise Physiologist;Carroll Enterkin, RN, BSN   Supervising physician immediately available to respond to emergencies See telemetry face sheet for immediately available ER MD   Medication changes reported     No   Fall or balance concerns reported    No   Warm-up and Cool-down Performed on first and last piece of equipment   Resistance Training Performed Yes   VAD Patient? No     Pain Assessment   Currently in Pain? No/denies   Multiple Pain Sites No         History  Smoking Status  . Former Smoker  . Types: Cigarettes  . Quit date: 11/25/1983  Smokeless Tobacco  . Never Used    Goals Met:  Independence with exercise equipment Exercise tolerated well No report of cardiac concerns or symptoms Strength training completed today  Goals Unmet:  Not Applicable  Comments: Pt able to follow exercise prescription today without complaint.  Will continue to monitor for progression. Reviewed home exercise with pt today.  Pt plans to continue walking at home for exercise.  Reviewed THR, pulse, RPE, sign and symptoms, and when to call 911 or MD.  Also discussed weather considerations and indoor options.  Pt voiced understanding.    Dr. Emily Filbert is Medical Director for [place] and [facility].

## 2017-02-10 DIAGNOSIS — I213 ST elevation (STEMI) myocardial infarction of unspecified site: Secondary | ICD-10-CM | POA: Diagnosis not present

## 2017-02-10 DIAGNOSIS — I2121 ST elevation (STEMI) myocardial infarction involving left circumflex coronary artery: Secondary | ICD-10-CM

## 2017-02-10 NOTE — Progress Notes (Signed)
Daily Session Note  Patient Details  Name: Jason Craig MRN: [id number] Date of Birth: 1941-10-11 Referring Provider:     Cardiac Rehab from 01/25/2017 in [facility] Cardiac and Pulmonary Rehab  Referring Provider  Lujean Amel MD      Encounter Date: 02/10/2017  Check In:     Session Check In - 02/10/17 0852      Check-In   Location [facility]-Cardiac & Pulmonary Rehab   Staff Present Alberteen Sam, MA, ACSM RCEP, Exercise Physiologist;Susanne Bice, RN, BSN, Lance Sell, BA, ACSM CEP, Exercise Physiologist   Supervising physician immediately available to respond to emergencies See telemetry face sheet for immediately available ER MD   Medication changes reported     No   Fall or balance concerns reported    No   Warm-up and Cool-down Performed on first and last piece of equipment   Resistance Training Performed Yes   VAD Patient? No     Pain Assessment   Currently in Pain? No/denies         History  Smoking Status  . Former Smoker  . Types: Cigarettes  . Quit date: 11/25/1983  Smokeless Tobacco  . Never Used    Goals Met:  Independence with exercise equipment Exercise tolerated well No report of cardiac concerns or symptoms Strength training completed today  Goals Unmet:  Not Applicable  Comments: Pt able to follow exercise prescription today without complaint.  Will continue to monitor for progression.    Dr. Emily Filbert is Medical Director for [place] and [facility].

## 2017-02-12 ENCOUNTER — Encounter: Payer: Medicare Other | Admitting: *Deleted

## 2017-02-12 DIAGNOSIS — I2121 ST elevation (STEMI) myocardial infarction involving left circumflex coronary artery: Secondary | ICD-10-CM

## 2017-02-12 DIAGNOSIS — I213 ST elevation (STEMI) myocardial infarction of unspecified site: Secondary | ICD-10-CM | POA: Diagnosis not present

## 2017-02-12 NOTE — Progress Notes (Signed)
Daily Session Note  Patient Details  Name: Jason Craig MRN: [id number] Date of Birth: January 24, 1941 Referring Provider:     Cardiac Rehab from 01/25/2017 in [facility] Cardiac and Pulmonary Rehab  Referring Provider  Lujean Amel MD      Encounter Date: 02/12/2017  Check In:     Session Check In - 02/12/17 0907      Check-In   Staff Present Heath Lark, RN, BSN, CCRP;Carroll Enterkin, RN, Levie Heritage, MA, ACSM RCEP, Exercise Physiologist   Supervising physician immediately available to respond to emergencies See telemetry face sheet for immediately available ER MD   Medication changes reported     No   Fall or balance concerns reported    No   Warm-up and Cool-down Performed on first and last piece of equipment   Resistance Training Performed Yes   VAD Patient? No     Pain Assessment   Currently in Pain? No/denies         History  Smoking Status  . Former Smoker  . Types: Cigarettes  . Quit date: 11/25/1983  Smokeless Tobacco  . Never Used    Goals Met:  Exercise tolerated well No report of cardiac concerns or symptoms Strength training completed today  Goals Unmet:  Not Applicable  Comments: Doing well with exercise prescription progression.    Dr. Emily Filbert is Medical Director for [place] and [facility].

## 2017-02-15 ENCOUNTER — Encounter: Payer: Medicare Other | Admitting: *Deleted

## 2017-02-15 DIAGNOSIS — I213 ST elevation (STEMI) myocardial infarction of unspecified site: Secondary | ICD-10-CM | POA: Diagnosis not present

## 2017-02-15 DIAGNOSIS — I2121 ST elevation (STEMI) myocardial infarction involving left circumflex coronary artery: Secondary | ICD-10-CM

## 2017-02-15 NOTE — Progress Notes (Signed)
Daily Session Note  Patient Details  Name: Jason Craig MRN: [id number] Date of Birth: October 20, 1941 Referring Provider:     Cardiac Rehab from 01/25/2017 in [facility] Cardiac and Pulmonary Rehab  Referring Provider  Lujean Amel MD      Encounter Date: 02/15/2017  Check In:     Session Check In - 02/15/17 0758      Check-In   Location [facility]-Cardiac & Pulmonary Rehab   Staff Present Alberteen Sam, MA, ACSM RCEP, Exercise Physiologist;Kelly Amedeo Plenty, BS, ACSM CEP, Exercise Physiologist;Carroll Enterkin, RN, BSN   Supervising physician immediately available to respond to emergencies See telemetry face sheet for immediately available ER MD   Medication changes reported     No   Fall or balance concerns reported    No   Warm-up and Cool-down Performed on first and last piece of equipment   Resistance Training Performed Yes   VAD Patient? No     Pain Assessment   Currently in Pain? No/denies   Multiple Pain Sites No         History  Smoking Status  . Former Smoker  . Types: Cigarettes  . Quit date: 11/25/1983  Smokeless Tobacco  . Never Used    Goals Met:  Independence with exercise equipment Exercise tolerated well No report of cardiac concerns or symptoms Strength training completed today  Goals Unmet:  Not Applicable  Comments: Pt able to follow exercise prescription today without complaint.  Will continue to monitor for progression.    Dr. Emily Filbert is Medical Director for [place] and [facility].

## 2017-02-17 DIAGNOSIS — I213 ST elevation (STEMI) myocardial infarction of unspecified site: Secondary | ICD-10-CM | POA: Diagnosis not present

## 2017-02-17 DIAGNOSIS — I2121 ST elevation (STEMI) myocardial infarction involving left circumflex coronary artery: Secondary | ICD-10-CM

## 2017-02-17 NOTE — Progress Notes (Signed)
Daily Session Note  Patient Details  Name: Jason Craig MRN: [id number] Date of Birth: 10-24-1941 Referring Provider:     Cardiac Rehab from 01/25/2017 in [facility] Cardiac and Pulmonary Rehab  Referring Provider  Lujean Amel MD      Encounter Date: 02/17/2017  Check In:     Session Check In - 02/17/17 6644      Check-In   Location [facility]-Cardiac & Pulmonary Rehab   Staff Present Heath Lark, RN, BSN, CCRP;Jessica Luan Pulling, MA, ACSM RCEP, Exercise Physiologist;Darrelyn Morro Oletta Darter, BA, ACSM CEP, Exercise Physiologist   Supervising physician immediately available to respond to emergencies See telemetry face sheet for immediately available ER MD   Medication changes reported     No   Fall or balance concerns reported    No   Warm-up and Cool-down Performed on first and last piece of equipment   Resistance Training Performed Yes   VAD Patient? No     Pain Assessment   Currently in Pain? No/denies         History  Smoking Status  . Former Smoker  . Types: Cigarettes  . Quit date: 11/25/1983  Smokeless Tobacco  . Never Used    Goals Met:  Independence with exercise equipment Exercise tolerated well No report of cardiac concerns or symptoms Strength training completed today  Goals Unmet:  Not Applicable  Comments: Pt able to follow exercise prescription today without complaint.  Will continue to monitor for progression.    Dr. Emily Filbert is Medical Director for [name] and [facility].

## 2017-02-19 ENCOUNTER — Encounter: Payer: Medicare Other | Admitting: *Deleted

## 2017-02-19 DIAGNOSIS — I213 ST elevation (STEMI) myocardial infarction of unspecified site: Secondary | ICD-10-CM | POA: Diagnosis not present

## 2017-02-19 DIAGNOSIS — I2121 ST elevation (STEMI) myocardial infarction involving left circumflex coronary artery: Secondary | ICD-10-CM

## 2017-02-19 NOTE — Progress Notes (Signed)
Daily Session Note  Patient Details  Name: Jason Craig MRN: [id number] Date of Birth: December 17, 1940 Referring Provider:     Cardiac Rehab from 01/25/2017 in [facility] Cardiac and Pulmonary Rehab  Referring Provider  Lujean Amel MD      Encounter Date: 02/19/2017  Check In:     Session Check In - 02/19/17 0901      Check-In   Location [facility]-Cardiac & Pulmonary Rehab   Staff Present Gerlene Bur[place], RN, BSN;Jessica Luan Pulling, MA, ACSM RCEP, Exercise Physiologist  Darel Hong RN BSN   Supervising physician immediately available to respond to emergencies See telemetry face sheet for immediately available ER MD   Medication changes reported     No   Fall or balance concerns reported    No   Tobacco Cessation No Change   Warm-up and Cool-down Performed on first and last piece of equipment   Resistance Training Performed Yes   VAD Patient? No     Pain Assessment   Currently in Pain? No/denies   Multiple Pain Sites No         History  Smoking Status  . Former Smoker  . Types: Cigarettes  . Quit date: 11/25/1983  Smokeless Tobacco  . Never Used    Goals Met:  Independence with exercise equipment Exercise tolerated well No report of cardiac concerns or symptoms Strength training completed today  Goals Unmet:  Not Applicable  Comments: Pt able to follow exercise prescription today without complaint.  Will continue to monitor for progression.    Dr. Emily Filbert is Medical Director for [place] and [facility].

## 2017-02-22 ENCOUNTER — Encounter: Payer: Medicare Other | Admitting: *Deleted

## 2017-02-22 DIAGNOSIS — I213 ST elevation (STEMI) myocardial infarction of unspecified site: Secondary | ICD-10-CM | POA: Diagnosis not present

## 2017-02-22 DIAGNOSIS — I2121 ST elevation (STEMI) myocardial infarction involving left circumflex coronary artery: Secondary | ICD-10-CM

## 2017-02-22 NOTE — Progress Notes (Signed)
Daily Session Note  Patient Details  Name: DELRAY REZA MRN: [id number] Date of Birth: [birth year] Referring Provider:     Cardiac Rehab from 01/25/2017 in [facility] Cardiac and Pulmonary Rehab  Referring Provider  Lujean Amel MD      Encounter Date: 02/22/2017  Check In:     Session Check In - 02/22/17 0809      Check-In   Location [facility]-Cardiac & Pulmonary Rehab   Staff Present Alberteen Sam, MA, ACSM RCEP, Exercise Physiologist;Kelly Amedeo Plenty, BS, ACSM CEP, Exercise Physiologist;Carroll Enterkin, RN, BSN   Supervising physician immediately available to respond to emergencies See telemetry face sheet for immediately available ER MD   Medication changes reported     No   Fall or balance concerns reported    No   Warm-up and Cool-down Performed on first and last piece of equipment   Resistance Training Performed Yes   VAD Patient? No     Pain Assessment   Currently in Pain? No/denies   Multiple Pain Sites No         History  Smoking Status  . Former Smoker  . Types: Cigarettes  . Quit date: 11/25/1983  Smokeless Tobacco  . Never Used    Goals Met:  Independence with exercise equipment Exercise tolerated well No report of cardiac concerns or symptoms Strength training completed today  Goals Unmet:  Not Applicable  Comments: Pt able to follow exercise prescription today without complaint.  Will continue to monitor for progression.    Dr. Emily Filbert is Medical Director for [place] and [facility].

## 2017-02-24 ENCOUNTER — Other Ambulatory Visit: Payer: Self-pay | Admitting: *Deleted

## 2017-02-24 ENCOUNTER — Encounter: Payer: Self-pay | Admitting: *Deleted

## 2017-02-24 DIAGNOSIS — I213 ST elevation (STEMI) myocardial infarction of unspecified site: Secondary | ICD-10-CM | POA: Diagnosis not present

## 2017-02-24 DIAGNOSIS — I2121 ST elevation (STEMI) myocardial infarction involving left circumflex coronary artery: Secondary | ICD-10-CM

## 2017-02-24 NOTE — Progress Notes (Signed)
Daily Session Note  Patient Details  Name: COREN SAGAN MRN: [id number] Date of Birth: 05/20/1941 Referring Provider:     Cardiac Rehab from 01/25/2017 in [facility] Cardiac and Pulmonary Rehab  Referring Provider  Lujean Amel MD      Encounter Date: 02/24/2017  Check In:     Session Check In - 02/24/17 0843      Check-In   Location [facility]-Cardiac & Pulmonary Rehab   Staff Present Alberteen Sam, MA, ACSM RCEP, Exercise Physiologist;Susanne Bice, RN, BSN, Lance Sell, BA, ACSM CEP, Exercise Physiologist   Supervising physician immediately available to respond to emergencies See telemetry face sheet for immediately available ER MD   Medication changes reported     No   Fall or balance concerns reported    No   Warm-up and Cool-down Performed on first and last piece of equipment   Resistance Training Performed Yes   VAD Patient? No     Pain Assessment   Currently in Pain? No/denies   Multiple Pain Sites No         History  Smoking Status  . Former Smoker  . Types: Cigarettes  . Quit date: 11/25/1983  Smokeless Tobacco  . Never Used    Goals Met:  Independence with exercise equipment Exercise tolerated well No report of cardiac concerns or symptoms Strength training completed today  Goals Unmet:  Not Applicable  Comments: Pt able to follow exercise prescription today without complaint.  Will continue to monitor for progression.    Dr. Emily Filbert is Medical Director for [place] and [facility].

## 2017-02-25 ENCOUNTER — Encounter: Payer: Self-pay | Admitting: Nurse Practitioner

## 2017-02-25 ENCOUNTER — Ambulatory Visit (INDEPENDENT_AMBULATORY_CARE_PROVIDER_SITE_OTHER): Payer: Medicare Other | Admitting: Nurse Practitioner

## 2017-02-25 VITALS — BP 142/72 | HR 58 | Temp 98.3°F | Resp 16 | Ht 70.0 in | Wt 197.0 lb

## 2017-02-25 DIAGNOSIS — H353113 Nonexudative age-related macular degeneration, right eye, advanced atrophic without subfoveal involvement: Secondary | ICD-10-CM | POA: Diagnosis not present

## 2017-02-25 DIAGNOSIS — Z961 Presence of intraocular lens: Secondary | ICD-10-CM | POA: Diagnosis not present

## 2017-02-25 DIAGNOSIS — H353223 Exudative age-related macular degeneration, left eye, with inactive scar: Secondary | ICD-10-CM | POA: Diagnosis not present

## 2017-02-25 DIAGNOSIS — I10 Essential (primary) hypertension: Secondary | ICD-10-CM | POA: Diagnosis not present

## 2017-02-25 DIAGNOSIS — H401131 Primary open-angle glaucoma, bilateral, mild stage: Secondary | ICD-10-CM | POA: Diagnosis not present

## 2017-02-25 DIAGNOSIS — I252 Old myocardial infarction: Secondary | ICD-10-CM

## 2017-02-25 DIAGNOSIS — H52223 Regular astigmatism, bilateral: Secondary | ICD-10-CM | POA: Diagnosis not present

## 2017-02-25 DIAGNOSIS — R739 Hyperglycemia, unspecified: Secondary | ICD-10-CM | POA: Diagnosis not present

## 2017-02-25 LAB — COMPLETE METABOLIC PANEL WITH GFR
ALT: 36 U/L (ref 9–46)
AST: 29 U/L (ref 10–35)
Albumin: 4 g/dL (ref 3.6–5.1)
Alkaline Phosphatase: 71 U/L (ref 40–115)
BUN: 20 mg/dL (ref 7–25)
CO2: 27 mmol/L (ref 20–31)
Calcium: 9.3 mg/dL (ref 8.6–10.3)
Chloride: 105 mmol/L (ref 98–110)
Creat: 0.97 mg/dL (ref 0.70–1.18)
GFR, Est African American: 87 mL/min (ref >=60)
GFR, Est Non African American: 75 mL/min (ref >=60)
Glucose, Bld: 89 mg/dL (ref 65–99)
Potassium: 4.2 mmol/L (ref 3.5–5.3)
Sodium: 140 mmol/L (ref 135–146)
Total Bilirubin: 0.8 mg/dL (ref 0.2–1.2)
Total Protein: 6.9 g/dL (ref 6.1–8.1)

## 2017-02-25 LAB — LIPID PANEL
Cholesterol: 75 mg/dL (ref <200)
HDL: 38 mg/dL — ABNORMAL LOW (ref >40)
LDL Cholesterol: 22 mg/dL (ref <100)
Total CHOL/HDL Ratio: 2 Ratio (ref <5.0)
Triglycerides: 76 mg/dL (ref <150)
VLDL: 15 mg/dL (ref <30)

## 2017-02-25 MED ORDER — FISH OIL 1000 MG PO CAPS: 1000.0000 mg | ORAL_CAPSULE | Freq: Every day | ORAL | 0 refills | Status: AC

## 2017-02-25 NOTE — Progress Notes (Signed)
Subjective:    Patient ID: Jason Craig, male    DOB: [birth year], 76 y.o.   MRN: [id number]  Jason Craig is a 76 y.o. male presenting on 02/25/2017 for Hypertension   HPI Hypertension He does not currently check his BP at home because it is checked 3 times per week at his cardiac rehab.  Usually it is in the range of 120s /60s  It has been 110/60 when finishes exercise.  He states he is doing well without any angina.  Pt denies headache, lightheadedness, dizziness, changes in vision, chest tightness/pressure, palpitations, sudden loss of speech or loss of consiousness.  He notes he did have a ham sandwich last night and that's probably why his BP is high today.     On statin therapy He was on statin therapy before STEMI and has been on fish oil 2,000 mg daily.      He walks 2 miles per day and exercises 3x per week at cardiac rehab.  He also works and gets regular activity there.  He eats fish 1x per week, but it is usually deep fried from [employer].  Otherwise his diet is pretty healthy with lots of veggies.  He is fasting today except for 1/2 tsp honey and some milk in his coffee about 1 hr before the visit.   Prediabetes He has "cut out sweets" after his MI on feb 17th.     Social History  Substance Use Topics  . Smoking status: Former Smoker    Types: Cigarettes    Quit date: 11/25/1983  . Smokeless tobacco: Never Used  . Alcohol use No    Review of Systems Per HPI unless specifically indicated above     Objective:    BP (!) 141/75 (BP Location: Left Arm, Patient Position: Sitting, Cuff Size: Normal)   Pulse (!) 58   Temp 98.3 F (36.8 C) (Oral)   Resp 16   Ht 5\' 10"  (1.778 m)   Wt 197 lb (89.4 kg)   BMI 28.27 kg/m    BP recheck: 142/72  Wt Readings from Last 3 Encounters:  02/25/17 197 lb (89.4 kg)  01/25/17 197 lb 1.6 oz (89.4 kg)  12/28/16 204 lb 12.8 oz (92.9 kg)    Physical Exam  Constitutional: He is oriented to person, place, and time. He  appears well-developed and well-nourished. No distress.  HENT:  Head: Normocephalic and atraumatic.  Neck: Normal range of motion. Neck supple. No JVD present. No tracheal deviation present. No thyromegaly present.  Negative carotid bruits  Cardiovascular: Normal rate, regular rhythm, normal heart sounds and intact distal pulses.   Pulmonary/Chest: Effort normal and breath sounds normal.  Abdominal: Soft. Bowel sounds are normal. He exhibits no distension. There is no tenderness.  No hepatosplenomegaly  Musculoskeletal: He exhibits no edema.  Lymphadenopathy:    He has no cervical adenopathy.  Neurological: He is alert and oriented to person, place, and time.  Skin: Skin is warm and dry.  Psychiatric: He has a normal mood and affect. Judgment and thought content normal.     Results for orders placed or performed in visit on 02/25/17  TSH  Result Value Ref Range   TSH 2.77 0.40 - 4.50 mIU/L  COMPLETE METABOLIC PANEL WITH GFR  Result Value Ref Range   Sodium 140 135 - 146 mmol/L   Potassium 4.2 3.5 - 5.3 mmol/L   Chloride 105 98 - 110 mmol/L   CO2 27 20 - 31 mmol/L  Glucose, Bld 89 65 - 99 mg/dL   BUN 20 7 - 25 mg/dL   Creat 0.97 0.70 - 1.18 mg/dL   Total Bilirubin 0.8 0.2 - 1.2 mg/dL   Alkaline Phosphatase 71 40 - 115 U/L   AST 29 10 - 35 U/L   ALT 36 9 - 46 U/L   Total Protein 6.9 6.1 - 8.1 g/dL   Albumin 4.0 3.6 - 5.1 g/dL   Calcium 9.3 8.6 - 10.3 mg/dL   GFR, Est African American 87 >=60 mL/min   GFR, Est Non African American 75 >=60 mL/min  Lipid panel  Result Value Ref Range   Cholesterol 75 <200 mg/dL   Triglycerides 76 <150 mg/dL   HDL 38 (L) >40 mg/dL   Total CHOL/HDL Ratio 2.0 <5.0 Ratio   VLDL 15 <30 mg/dL   LDL Cholesterol 22 <100 mg/dL      Assessment & Plan:   Problem List Items Addressed This Visit      Cardiovascular and Mediastinum   HTN (hypertension) - Primary Controlled.  Plan: 1. Continue current medications:Lopressor 25 mg bid; losartan  25 mg daily   Relevant Medications   Omega-3 Fatty Acids (FISH OIL) 1000 MG CAPS   Other Relevant Orders   TSH (Completed)   COMPLETE METABOLIC PANEL WITH GFR (Completed)   Lipid panel (Completed)     Other   Blood glucose elevated Controlled with diet and exercise. A1c no longer in prediabetes range.  Plan:  1. Continue lifestyle modifications.   2. Recheck A1c 6 months.   Relevant Orders   COMPLETE METABOLIC PANEL WITH GFR (Completed)   Lipid panel (Completed)   On statin therapy Low Cholesterol at last check well below goal values.  Plan: 1. Check lipid panel. 2. Check CMP for liver function on statin. 3. Repeat CMP normal.  Repeat lipid panel HDL improved some.  Total cholesterol and LDL still remain well below goal.   4. Contacted patient to reduce atorvastatin to 20 mg daily.  He refused, citing his Cardiologist prescribes this medication and he will not change it.  History of ST elevation myocardial infarction (STEMI) No TSH screening value on record.    Plan: 1. Screen TSH and correct for any possible cardiac symptoms. 2. TSH normal - no action necessary.   Relevant Medications   Omega-3 Fatty Acids (FISH OIL) 1000 MG CAPS   Other Relevant Orders   TSH (Completed)   Lipid panel (Completed)      Meds ordered this encounter  Medications  . Omega-3 Fatty Acids (FISH OIL) 1000 MG CAPS    Sig: Take 1 capsule (1,000 mg total) by mouth daily.    Dispense:  30 capsule    Refill:  0    Order Specific Question:   Supervising Provider    Answer:   Olin Hauser [[id number]]  . Omega-3 Fatty Acids (SALMON OIL-1000 PO)    Sig: Take 2,000 mg by mouth.  . multivitamin-lutein (OCUVITE-LUTEIN) CAPS capsule    Sig: Take 1 capsule by mouth daily.  . Probiotic Product (PROBIOTIC PO)    Sig: Take 1 capsule by mouth daily.      Follow up plan: Return in about 6 months (around 08/27/2017) for BP, cholesterol, glucose.   Cassell Smiles, DNP, AGPCNP-BC Adult  Gerontology Primary Care Nurse Practitioner [facility] 02/27/2017, 7:43 PM

## 2017-02-25 NOTE — Patient Instructions (Signed)
Jason Craig, Thank you for coming in to clinic today.  1. For your blood pressure, continue your current medications.  If you notice that you are consistently greater than 130/90 either number, let me know.  2. For your glucose:  Keep eating a healthy diet and watching your sugar and carbohydrate intake.  We will recheck your hemoglobin A1c in 6 months.  Keep exercising because this also helps your blood sugar control.  3. For your cholesterol:  We want a higher HDL than your last reading. Recheck lipid panel today for any changes.   - Start fish oil 1000 mg daily.    HDL is your good cholesterol.  When it is higher than 50, it can be protective against and prevent cholesterol buildup in your arteries that could lead to heart attack or stroke.  You can increase your HDL by exercising, by taking fish oil 1,000 mg daily or by increasing your dietary omega-3 fatty acids.  Omega-3s are found in olive oil, fish, and seeds and nuts like almonds, walnuts, and pecans.    4. Because you have had high blood pressure and a heart attack, we are going to screen for high or low thyroid hormone with a TSH lab.  I will let you know the results.   Please schedule a follow-up appointment with Cassell Smiles, AGNP in 6 months.  If you have any other questions or concerns, please feel free to call the clinic or send a message through [place]. You may also schedule an earlier appointment if necessary.  Cassell Smiles, DNP, AGNP-BC Adult Gerontology Nurse Practitioner [place]

## 2017-02-26 ENCOUNTER — Other Ambulatory Visit: Payer: Self-pay | Admitting: Nurse Practitioner

## 2017-02-26 ENCOUNTER — Encounter: Payer: Medicare Other | Admitting: *Deleted

## 2017-02-26 DIAGNOSIS — I2121 ST elevation (STEMI) myocardial infarction involving left circumflex coronary artery: Secondary | ICD-10-CM

## 2017-02-26 DIAGNOSIS — I213 ST elevation (STEMI) myocardial infarction of unspecified site: Secondary | ICD-10-CM | POA: Diagnosis not present

## 2017-02-26 LAB — TSH: TSH: 2.77 mIU/L (ref 0.40–4.50)

## 2017-02-26 MED ORDER — ATORVASTATIN CALCIUM 40 MG PO TABS: 40.0000 mg | ORAL_TABLET | Freq: Every day | ORAL | 1 refills | Status: DC

## 2017-02-26 MED ORDER — ATORVASTATIN CALCIUM 20 MG PO TABS: 20.0000 mg | ORAL_TABLET | Freq: Every day | ORAL | 1 refills | Status: DC

## 2017-02-26 NOTE — Progress Notes (Signed)
Daily Session Note  Patient Details  Name: YUMA PACELLA MRN: [id number] Date of Birth: [birth year] Referring Provider:     Cardiac Rehab from 01/25/2017 in [facility] Cardiac and Pulmonary Rehab  Referring Provider  Lujean Amel MD      Encounter Date: 02/26/2017  Check In:     Session Check In - 02/26/17 0858      Check-In   Location [facility]-Cardiac & Pulmonary Rehab   Staff Present Gerlene Bur[place], RN, BSN;Susanne Bice, RN, BSN, CCRP;Jessica Luan Pulling, MA, ACSM RCEP, Exercise Physiologist   Supervising physician immediately available to respond to emergencies See telemetry face sheet for immediately available ER MD   Medication changes reported     No   Fall or balance concerns reported    No   Warm-up and Cool-down Performed on first and last piece of equipment   Resistance Training Performed Yes   VAD Patient? No     Pain Assessment   Currently in Pain? No/denies           Exercise Prescription Changes - 02/25/17 1200      Response to Exercise   Blood Pressure (Admit) 138/62   Blood Pressure (Exercise) 136/70   Blood Pressure (Exit) 112/60   Heart Rate (Admit) 49 bpm   Heart Rate (Exercise) 98 bpm   Heart Rate (Exit) 64 bpm   Rating of Perceived Exertion (Exercise) 12   Symptoms none   Duration Continue with 45 min of aerobic exercise without signs/symptoms of physical distress.   Intensity THRR unchanged     Progression   Progression Continue to progress workloads to maintain intensity without signs/symptoms of physical distress.   Average METs 4.87     Resistance Training   Training Prescription Yes   Weight 4 lbs   Reps 10-15     Interval Training   Interval Training No     Treadmill   MPH 3.8   Grade 2   Minutes 15   METs 4.96     NuStep   Level 7   SPM 100   Minutes 15   METs 5.1     REL-XR   Level 4.5   Minutes 15   METs 3.4     Home Exercise Plan   Plans to continue exercise at Home (comment)  walking   Frequency Add 3 additional days  to program exercise sessions.   Initial Home Exercises Provided 02/08/17      History  Smoking Status  . Former Smoker  . Types: Cigarettes  . Quit date: 11/25/1983  Smokeless Tobacco  . Never Used    Goals Met:  Proper associated with RPD/PD & O2 Sat Exercise tolerated well No report of cardiac concerns or symptoms  Goals Unmet:  Not Applicable  Comments:     Dr. Emily Filbert is Medical Director for [place] and [facility].

## 2017-02-26 NOTE — Progress Notes (Signed)
Patient declined changing his dose and defers this to his Cardiologist.  Please re-evaluate with cardiology appointment.

## 2017-03-01 ENCOUNTER — Encounter: Payer: Medicare Other | Admitting: *Deleted

## 2017-03-01 DIAGNOSIS — I213 ST elevation (STEMI) myocardial infarction of unspecified site: Secondary | ICD-10-CM | POA: Diagnosis not present

## 2017-03-01 DIAGNOSIS — I2121 ST elevation (STEMI) myocardial infarction involving left circumflex coronary artery: Secondary | ICD-10-CM

## 2017-03-01 NOTE — Progress Notes (Signed)
I have reviewed this encounter including the documentation in this note and/or discussed this patient with the provider, Cassell Smiles, AGPCNP-BC. I am certifying that I agree with the content of this note as supervising physician.  [name], [facility] 03/01/2017, 2:53 PM

## 2017-03-01 NOTE — Progress Notes (Signed)
Daily Session Note  Patient Details  Name: Jason Craig MRN: [id number] Date of Birth: 1941/04/29 Referring Provider:     Cardiac Rehab from 01/25/2017 in [facility] Cardiac and Pulmonary Rehab  Referring Provider  Lujean Amel MD      Encounter Date: 03/01/2017  Check In:     Session Check In - 03/01/17 0751      Check-In   Location [facility]-Cardiac & Pulmonary Rehab   Staff Present Gerlene [place], RN, Moises Blood, BS, ACSM CEP, Exercise Physiologist;Jessica Luan Pulling, [place], ACSM RCEP, Exercise Physiologist   Supervising physician immediately available to respond to emergencies See telemetry face sheet for immediately available ER MD   Medication changes reported     No   Fall or balance concerns reported    No   Warm-up and Cool-down Performed on first and last piece of equipment   Resistance Training Performed Yes   VAD Patient? No     Pain Assessment   Currently in Pain? No/denies   Multiple Pain Sites No         History  Smoking Status  . Former Smoker  . Types: Cigarettes  . Quit date: 11/25/1983  Smokeless Tobacco  . Never Used    Goals Met:  Independence with exercise equipment Exercise tolerated well No report of cardiac concerns or symptoms Strength training completed today  Goals Unmet:  Not Applicable  Comments: Pt able to follow exercise prescription today without complaint.  Will continue to monitor for progression.    Dr. Emily Filbert is Medical Director for [place] and [facility].

## 2017-03-03 ENCOUNTER — Encounter: Payer: Self-pay | Admitting: *Deleted

## 2017-03-03 DIAGNOSIS — I2121 ST elevation (STEMI) myocardial infarction involving left circumflex coronary artery: Secondary | ICD-10-CM

## 2017-03-03 DIAGNOSIS — I213 ST elevation (STEMI) myocardial infarction of unspecified site: Secondary | ICD-10-CM | POA: Diagnosis not present

## 2017-03-03 NOTE — Progress Notes (Signed)
Daily Session Note  Patient Details  Name: Jason Craig MRN: [id number] Date of Birth: 05/14/1941 Referring Provider:     Cardiac Rehab from 01/25/2017 in [facility]  Referring Provider  Lujean Amel MD      Encounter Date: 03/03/2017  Check In:     Session Check In - 03/03/17 0808      Check-In   Staff Present Alberteen Sam, MA, ACSM RCEP, Exercise Physiologist;Susanne Bice, RN, BSN, Lance Sell, BA, ACSM CEP, Exercise Physiologist   Supervising physician immediately available to respond to emergencies See telemetry face sheet for immediately available ER MD   Medication changes reported     No   Fall or balance concerns reported    No   Warm-up and Cool-down Performed on first and last piece of equipment   Resistance Training Performed Yes   VAD Patient? No     Pain Assessment   Currently in Pain? No/denies         History  Smoking Status  . Former Smoker  . Types: Cigarettes  . Quit date: 11/25/1983  Smokeless Tobacco  . Never Used    Goals Met:  Independence with exercise equipment Exercise tolerated well No report of cardiac concerns or symptoms Strength training completed today  Goals Unmet:  Not Applicable  Comments: Pt able to follow exercise prescription today without complaint.  Will continue to monitor for progression.    Dr. Emily Filbert is Medical Director for [facility] and [facility].

## 2017-03-03 NOTE — Progress Notes (Signed)
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  aspirin EC 81 MG EC tablet, Take 1 tablet (81 mg total) by mouth daily., Disp: 30 tablet, Rfl: 0 .  atorvastatin (LIPITOR) 40 MG tablet, Take 1 tablet (40 mg total) by mouth daily at 6 PM., Disp: 90 tablet, Rfl: 1 .  Calcium Citrate-Vitamin D (CALCIUM + D PO), Take 1 tablet by mouth daily., Disp: , Rfl:  .  clopidogrel (PLAVIX) 75 MG tablet, Take 1 tablet (75 mg total) by mouth daily with breakfast., Disp: 30 tablet, Rfl: 0 .  Coenzyme Q10 (COQ10 PO), Take 1 capsule by mouth daily., Disp: , Rfl:  .  latanoprost (XALATAN) 0.005 % ophthalmic solution, Place 1 drop into both eyes at bedtime., Disp: , Rfl: 0 .  losartan (COZAAR) 25 MG tablet, Take 25 mg by mouth daily., Disp: , Rfl:  .  metoprolol tartrate (LOPRESSOR) 50 MG tablet, Take 0.5 tablets (25 mg total) by mouth 2 (two) times daily., Disp: 60 tablet, Rfl: 0 .  Multiple Vitamin (MULTIVITAMIN WITH MINERALS) TABS tablet, Take 1 tablet by mouth daily., Disp: , Rfl:  .  multivitamin-lutein (OCUVITE-LUTEIN) CAPS capsule, Take 1 capsule by mouth daily., Disp: , Rfl:  .  Omega-3 Fatty Acids (FISH OIL) 1000 MG CAPS, Take 1 capsule (1,000 mg total) by mouth daily., Disp: 30 capsule, Rfl: 0 .  Omega-3 Fatty Acids (SALMON OIL-1000 PO), Take 2,000 mg by mouth., Disp: , Rfl:  .  Probiotic Product (PROBIOTIC PO), Take 1 capsule by mouth daily., Disp: , Rfl:   Past  Medical History: Past Medical History:  Diagnosis Date  . Hypertension   . 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 Rib fracture 07/11/2015   Horse Accident     Tobacco Use: History  Smoking Status  . Former Smoker  . Types: Cigarettes  . Quit date: 11/25/1983  Smokeless Tobacco  . Never Used    Labs: Recent Review Flowsheet Data    Labs for ITP Cardiac and Pulmonary Rehab Latest Ref Rng & Units 12/27/2016 02/25/2017   Cholestrol <200 mg/dL 91 75   LDLCALC <100 mg/dL 39 22   HDL >40 mg/dL 29(L) 38(L)   Trlycerides <150 mg/dL 117 76   Hemoglobin A1c 4.8 - 5.6 % 5.7(H) -       Exercise Target Goals:    Exercise Program Goal: Individual exercise prescription set with THRR, safety & activity barriers. Participant demonstrates ability to understand and report RPE using BORG scale, to self-measure pulse accurately, and to acknowledge the importance of the exercise prescription.  Exercise Prescription Goal: Starting with aerobic activity 30 plus minutes a day, 3 days per week for initial exercise prescription. Provide home exercise prescription and guidelines that participant acknowledges understanding prior to discharge.  Activity Barriers & Risk Stratification:     Activity Barriers & Cardiac Risk Stratification - 01/25/17 1318      Activity  Barriers & Cardiac Risk Stratification   Activity Barriers None   Cardiac Risk Stratification High      6 Minute Walk:     6 Minute Walk    Row Name 01/25/17 1426         6 Minute Walk   Phase Initial     Distance 1870 feet     Walk Time 6 minutes     # of Rest Breaks 0     MPH 3.54     METS 3.95     RPE 9     VO2 Peak 13.82     Symptoms No     Resting HR 54 bpm     Resting BP 116/64     Max Ex. HR 127 bpm     Max Ex. BP 126/64     2 Minute Post BP 122/60        Oxygen Initial Assessment:   Oxygen Re-Evaluation:   Oxygen Discharge (Final Oxygen  Re-Evaluation):   Initial Exercise Prescription:     Initial Exercise Prescription - 01/25/17 1400      Date of Initial Exercise RX and Referring Provider   Date 01/25/17   Referring Provider Dorothyann Peng MD     Treadmill   MPH 3.5   Grade 0.5   Minutes 15   METs 3.68     NuStep   Level 3   SPM 100   Minutes 15   METs 3     REL-XR   Level 3   Speed 50   Minutes 15   METs 3     Prescription Details   Frequency (times per week) 3   Duration Progress to 45 minutes of aerobic exercise without signs/symptoms of physical distress     Intensity   THRR 40-80% of Max Heartrate 90-125   Ratings of Perceived Exertion 11-13   Perceived Dyspnea 0-4     Progression   Progression Continue to progress workloads to maintain intensity without signs/symptoms of physical distress.     Resistance Training   Training Prescription Yes   Weight 3 lbs      Perform Capillary Blood Glucose checks as needed.  Exercise Prescription Changes:     Exercise Prescription Changes    Row Name 01/25/17 1400 02/08/17 1000 02/10/17 1600 02/25/17 1200       Response to Exercise   Blood Pressure (Admit) 116/64  - 106/54 138/62    Blood Pressure (Exercise) 126/64  - 144/60 136/70    Blood Pressure (Exit) 122/60  - 124/68 112/60    Heart Rate (Admit) 54 bpm  - 74 bpm 49 bpm    Heart Rate (Exercise) 127 bpm  - 105 bpm 98 bpm    Heart Rate (Exit) 58 bpm  - 54 bpm 64 bpm    Oxygen Saturation (Admit) 99 %  -  -  -    Oxygen Saturation (Exercise) 98 %  -  -  -    Rating of Perceived Exertion (Exercise) 9  - 12 12    Symptoms none none none none    Comments walk test results  -  -  -    Duration  -  - Continue with 45 min of aerobic exercise without signs/symptoms of physical distress. Continue with 45 min of aerobic exercise without signs/symptoms of physical distress.    Intensity  -  - THRR unchanged THRR unchanged      Progression   Progression  -  -  Continue to progress workloads to  maintain intensity without signs/symptoms of physical distress. Continue to progress workloads to maintain intensity without signs/symptoms of physical distress.    Average METs  -  - 3.97 4.87      Resistance Training   Training Prescription  - Yes Yes Yes    Weight  - 3 lbs 3 lbs 4 lbs    Reps  -  - 10-15 10-15      Interval Training   Interval Training  -  - No No      Treadmill   MPH  - 3.5 3.5 3.8    Grade  - 0.5 0.5 2    Minutes  - '15 15 15    '$ METs  - 3.68 3.92 4.96      NuStep   Level  - '3 6 7    '$ SPM  - 100 100 100    Minutes  - '15 15 15    '$ METs  - 3 4 5.1      REL-XR   Level  - 3 4 4.5    Speed  - 50  -  -    Minutes  - '15 15 15    '$ METs  - 3 4 3.4      Home Exercise Plan   Plans to continue exercise at  - Home (comment)  walking Home (comment)  walking Home (comment)  walking    Frequency  - Add 3 additional days to program exercise sessions. Add 3 additional days to program exercise sessions. Add 3 additional days to program exercise sessions.    Initial Home Exercises Provided  - 02/08/17 02/08/17 02/08/17       Exercise Comments:     Exercise Comments    Row Name 01/29/17 0756 02/03/17 0915 02/12/17 0846 03/01/17 0840     Exercise Comments First full day of exercise!  Patient was oriented to gym and equipment including functions, settings, policies, and procedures.  Patient's individual exercise prescription and treatment plan were reviewed.  All starting workloads were established based on the results of the 6 minute walk test done at initial orientation visit.  The plan for exercise progression was also introduced and progression will be customized based on patient's performance and goals. Switched from XR to REL permanently due to availability. Reviewed METs average and discussed progression with pt today. Reviewed METs average and discussed progression with pt today.       Exercise Goals and Review:     Exercise Goals    Row Name 01/25/17 1430              Exercise Goals   Increase Physical Activity Yes       Intervention Provide advice, education, support and counseling about physical activity/exercise needs.;Develop an individualized exercise prescription for aerobic and resistive training based on initial evaluation findings, risk stratification, comorbidities and participant's personal goals.       Expected Outcomes Achievement of increased cardiorespiratory fitness and enhanced flexibility, muscular endurance and strength shown through measurements of functional capacity and personal statement of participant.       Increase Strength and Stamina Yes       Intervention Provide advice, education, support and counseling about physical activity/exercise needs.;Develop an individualized exercise prescription for aerobic and resistive training based on initial evaluation findings, risk stratification, comorbidities and participant's personal goals.       Expected Outcomes Achievement of increased cardiorespiratory fitness and enhanced flexibility, muscular endurance and strength shown  through measurements of functional capacity and personal statement of participant.          Exercise Goals Re-Evaluation :     Exercise Goals Re-Evaluation    Row Name 02/08/17 1020 02/10/17 1613 02/25/17 1208         Exercise Goal Re-Evaluation   Exercise Goals Review Increase Physical Activity;Increase Strenth and Stamina Increase Physical Activity;Increase Strenth and Stamina Increase Physical Activity;Increase Strenth and Stamina     Comments Reviewed home exercise with pt today.  Pt plans to continue walking at home for exercise.  Reviewed THR, pulse, RPE, sign and symptoms, and when to call 911 or MD.  Also discussed weather considerations and indoor options.  Pt voiced understanding. Laverna Peace is off to a good start with rehab.  He is enjoying the REL.  He is now up to level four on both the REL and NuStep.  We will continue to monitor his progression.  Laverna Peace has been doing well in rehab.  He is now up to level 7 on the NuStep.  We will continue to monitor his progression.     Expected Outcomes Short and Long: Continue to walk at home for exercise to increase strength and stamina Short: Laverna Peace will continue to increase workloads and add more incline on treadmill.  Long: Continue to come to class to work on strength and stamina. Short: We will discuss adding in interval training.  Long: Continue to work on IT sales professional.        Discharge Exercise Prescription (Final Exercise Prescription Changes):     Exercise Prescription Changes - 02/25/17 1200      Response to Exercise   Blood Pressure (Admit) 138/62   Blood Pressure (Exercise) 136/70   Blood Pressure (Exit) 112/60   Heart Rate (Admit) 49 bpm   Heart Rate (Exercise) 98 bpm   Heart Rate (Exit) 64 bpm   Rating of Perceived Exertion (Exercise) 12   Symptoms none   Duration Continue with 45 min of aerobic exercise without signs/symptoms of physical distress.   Intensity THRR unchanged     Progression   Progression Continue to progress workloads to maintain intensity without signs/symptoms of physical distress.   Average METs 4.87     Resistance Training   Training Prescription Yes   Weight 4 lbs   Reps 10-15     Interval Training   Interval Training No     Treadmill   MPH 3.8   Grade 2   Minutes 15   METs 4.96     NuStep   Level 7   SPM 100   Minutes 15   METs 5.1     REL-XR   Level 4.5   Minutes 15   METs 3.4     Home Exercise Plan   Plans to continue exercise at Home (comment)  walking   Frequency Add 3 additional days to program exercise sessions.   Initial Home Exercises Provided 02/08/17      Nutrition:  Target Goals: Understanding of nutrition guidelines, daily intake of sodium '1500mg'$ , cholesterol '200mg'$ , calories 30% from fat and 7% or less from saturated fats, daily to have 5 or more servings of fruits and vegetables.  Biometrics:     Pre  Biometrics - 01/25/17 1430      Pre Biometrics   Height 6' 0.3" (1.836 m)   Weight 197 lb 1.6 oz (89.4 kg)   Waist Circumference 39 inches   Hip Circumference 40.5 inches   Waist to Hip Ratio  0.96 %   BMI (Calculated) 26.6   Single Leg Stand 30 seconds       Nutrition Therapy Plan and Nutrition Goals:     Nutrition Therapy & Goals - 02/12/17 0751      Personal Nutrition Goals   Nutrition Goal Continue with current eating pattern   Personal Goal #2 ideally include protein food with breakfast and lunch as well as supper.      Nutrition Discharge: Rate Your Plate Scores:     Nutrition Assessments - 01/25/17 1315      MEDFICTS Scores   Pre Score 49      Nutrition Goals Re-Evaluation:     Nutrition Goals Re-Evaluation    Row Name 02/12/17 0753             Goals   Nutrition Goal Continue with current eating pattern       Comment Continues to eat as has been, adding some more meat to meals.  Stated may try protein drink during day since does not eat protein item at breakfast       Expected Outcome Continue with nutrition plan as is with more protein by more meat selection or use of the protein drink         Personal Goal #2 Re-Evaluation   Personal Goal #2 ideally include protein food with breakfast and lunch as well as supper.          Nutrition Goals Discharge (Final Nutrition Goals Re-Evaluation):     Nutrition Goals Re-Evaluation - 02/12/17 0753      Goals   Nutrition Goal Continue with current eating pattern   Comment Continues to eat as has been, adding some more meat to meals.  Stated may try protein drink during day since does not eat protein item at breakfast   Expected Outcome Continue with nutrition plan as is with more protein by more meat selection or use of the protein drink     Personal Goal #2 Re-Evaluation   Personal Goal #2 ideally include protein food with breakfast and lunch as well as supper.      Psychosocial: Target Goals:  Acknowledge presence or absence of significant depression and/or stress, maximize coping skills, provide positive support system. Participant is able to verbalize types and ability to use techniques and skills needed for reducing stress and depression.   Initial Review & Psychosocial Screening:     Initial Psych Review & Screening - 01/25/17 1211      Initial Review   Current issues with None Identified     Family Dynamics   Good Support System? Yes     Barriers   Psychosocial barriers to participate in program There are no identifiable barriers or psychosocial needs.     Screening Interventions   Interventions Encouraged to exercise      Quality of Life Scores:      Quality of Life - 01/25/17 1209      Quality of Life Scores   Health/Function Pre 30 %   Socioeconomic Pre 30 %   Psych/Spiritual Pre 30 %   Family Pre 30 %   GLOBAL Pre 30 %      PHQ-9: Recent Review Flowsheet Data    Depression screen [facility] 2/9 02/25/2017 01/25/2017 08/27/2016 08/22/2015   Decreased Interest 0 0 0 0   Down, Depressed, Hopeless 0 0 0 0   PHQ - 2 Score 0 0 0 0   Altered sleeping - 0 - -   Tired, decreased energy -  0 - -   Change in appetite - 0 - -   Feeling bad or failure about yourself  - 0 - -   Trouble concentrating - 0 - -   Moving slowly or fidgety/restless - 0 - -   Suicidal thoughts - 0 - -   PHQ-9 Score - 0 - -     Interpretation of Total Score  Total Score Depression Severity:  1-4 = Minimal depression, 5-9 = Mild depression, 10-14 = Moderate depression, 15-19 = Moderately severe depression, 20-27 = Severe depression   Psychosocial Evaluation and Intervention:     Psychosocial Evaluation - 02/01/17 0948      Psychosocial Evaluation & Interventions   Comments Counselor met with Mr. Achord Clair Gulling) today for initial psychosocial evaluation.  He is a well-adjusted 76 year old who had a heart attack in February and (1) stent inserted.  Clair Gulling has a strong support system with a  spouse of 14 years and (2) adult sons who live close by.  He also has (2) sisters locally and he is actively involved in his local church.  Clair Gulling reports sleeping well and having a great appetite.  He denies a history of depression or anxiety or any current symptoms.  Clair Gulling reports being in a positive mood most of the time and has minimal stress in his life.  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  He has goals to continue increasing his stamina and strength and to maybe even with diet and exercise clear the other minor blockage that his Dr. reported he has.  In addition to [place] walks 1 mile (under 17 minutes) 2x/day.   He will be followed by staff throughout the course of this program.      Expected Outcomes Clair Gulling will continue to work out consistently; and to strengthen his heart while in Cardiac Rehab.   Continue Psychosocial Services  No Follow up required      Psychosocial Re-Evaluation:     Psychosocial Re-Evaluation    [place] Name 02/12/17 0755             Psychosocial Re-Evaluation   Current issues with None Identified       Interventions Encouraged to attend Cardiac Rehabilitation for the exercise       Continue Psychosocial Services  No Follow up required          Psychosocial Discharge (Final Psychosocial Re-Evaluation):     Psychosocial Re-Evaluation - 02/12/17 0755      Psychosocial Re-Evaluation   Current issues with None Identified   Interventions Encouraged to attend Cardiac Rehabilitation for the exercise   Continue Psychosocial Services  No Follow up required      Vocational Rehabilitation: Provide vocational rehab assistance to qualifying candidates.   Vocational Rehab Evaluation & Intervention:     Vocational Rehab - 01/25/17 1316      Initial Vocational Rehab Evaluation & Intervention   Assessment shows need for Vocational Rehabilitation No      Education: Education Goals: Education classes will be  provided on a weekly basis, covering required topics. Participant will state understanding/return demonstration of topics presented.  Learning Barriers/Preferences:     Learning Barriers/Preferences - 01/25/17 1316      Learning Barriers/Preferences   Learning Barriers None   Learning Preferences Individual Instruction      Education Topics: General Nutrition Guidelines/Fats and Fiber: -Group instruction provided by verbal, written material, models and posters to present the general guidelines  for heart healthy nutrition. Gives an explanation and review of dietary fats and fiber.   Controlling Sodium/Reading Food Labels: -Group verbal and written material supporting the discussion of sodium use in heart healthy nutrition. Review and explanation with models, verbal and written materials for utilization of the food label.   Exercise Physiology & Risk Factors: - Group verbal and written instruction with models to review the exercise physiology of the cardiovascular system and associated critical values. Details cardiovascular disease risk factors and the goals associated with each risk factor.   Aerobic Exercise & Resistance Training: - Gives group verbal and written discussion on the health impact of inactivity. On the components of aerobic and resistive training programs and the benefits of this training and how to safely progress through these programs.   Flexibility, Balance, General Exercise Guidelines: - Provides group verbal and written instruction on the benefits of flexibility and balance training programs. Provides general exercise guidelines with specific guidelines to those with heart or lung disease. Demonstration and skill practice provided.   Cardiac Rehab from 03/01/2017 in [facility] Cardiac and Pulmonary Rehab  Date  02/01/17  Educator  [facility]  Instruction Review Code  2- meets goals/outcomes      Stress Management: - Provides group verbal and written instruction about the  health risks of elevated stress, cause of high stress, and healthy ways to reduce stress.   Cardiac Rehab from 03/01/2017 in [facility] Cardiac and Pulmonary Rehab  Date  02/10/17  Educator  [facility]  Instruction Review Code  2- meets goals/outcomes      Depression: - Provides group verbal and written instruction on the correlation between heart/lung disease and depressed mood, treatment options, and the stigmas associated with seeking treatment.   Anatomy & Physiology of the Heart: - Group verbal and written instruction and models provide basic cardiac anatomy and physiology, with the coronary electrical and arterial systems. Review of: AMI, Angina, Valve disease, Heart Failure, Cardiac Arrhythmia, Pacemakers, and the ICD.   Cardiac Rehab from 03/01/2017 in [facility] Cardiac and Pulmonary Rehab  Date  02/08/17  Educator  CE  Instruction Review Code  2- meets goals/outcomes      Cardiac Procedures: - Group verbal and written instruction and models to describe the testing methods done to diagnose heart disease. Reviews the outcomes of the test results. Describes the treatment choices: Medical Management, Angioplasty, or Coronary Bypass Surgery.   Cardiac Rehab from 03/01/2017 in [facility] Cardiac and Pulmonary Rehab  Date  02/15/17  Educator  CE  Instruction Review Code  2- meets goals/outcomes      Cardiac Medications: - Group verbal and written instruction to review commonly prescribed medications for heart disease. Reviews the medication, class of the drug, and side effects. Includes the steps to properly store meds and maintain the prescription regimen.   Cardiac Rehab from 03/01/2017 in [facility] Cardiac and Pulmonary Rehab  Date  02/24/17  Educator  SB  Instruction Review Code  2- meets goals/outcomes [part one]      Go Sex-Intimacy & Heart Disease, Get SMART - Goal Setting: - Group verbal and written instruction through game format to discuss heart disease and the return to sexual intimacy. Provides  group verbal and written material to discuss and apply goal setting through the application of the S.M.A.R.T. Method.   Cardiac Rehab from 03/01/2017 in [facility] Cardiac and Pulmonary Rehab  Date  02/15/17  Educator  CE  Instruction Review Code  2- meets goals/outcomes      Other Matters of  the Heart: - Provides group verbal, written materials and models to describe Heart Failure, Angina, Valve Disease, and Diabetes in the realm of heart disease. Includes description of the disease process and treatment options available to the cardiac patient.   Cardiac Rehab from 03/01/2017 in [facility] Cardiac and Pulmonary Rehab  Date  02/08/17  Educator  CE  Instruction Review Code  2- meets goals/outcomes      Exercise & Equipment Safety: - Individual verbal instruction and demonstration of equipment use and safety with use of the equipment.   Cardiac Rehab from 03/01/2017 in [facility] Cardiac and Pulmonary Rehab  Date  01/25/17  Educator  C. EnterkinRN  Instruction Review Code  1- partially meets, needs review/practice      Infection Prevention: - Provides verbal and written material to individual with discussion of infection control including proper hand washing and proper equipment cleaning during exercise session.   Cardiac Rehab from 03/01/2017 in [facility] Cardiac and Pulmonary Rehab  Date  01/25/17  Educator  C. EnterkinRN  Instruction Review Code  2- meets goals/outcomes      Falls Prevention: - Provides verbal and written material to individual with discussion of falls prevention and safety.   Cardiac Rehab from 03/01/2017 in [facility] Cardiac and Pulmonary Rehab  Date  01/25/17  Educator  C. Enterkin Therapist, sports  Instruction Review Code  2- meets goals/outcomes      Diabetes: - Individual verbal and written instruction to review signs/symptoms of diabetes, desired ranges of glucose level fasting, after meals and with exercise. Advice that pre and post exercise glucose checks will be done for 3 sessions at  entry of program.    Knowledge Questionnaire Score:     Knowledge Questionnaire Score - 01/25/17 1315      Knowledge Questionnaire Score   Pre Score 24/28      Core Components/Risk Factors/Patient Goals at Admission:     Personal Goals and Risk Factors at Admission - 01/25/17 1318      Core Components/Risk Factors/Patient Goals on Admission    Weight Management Weight Loss;Yes   Intervention Weight Management: Develop a combined nutrition and exercise program designed to reach desired caloric intake, while maintaining appropriate intake of nutrient and fiber, sodium and fats, and appropriate energy expenditure required for the weight goal.;Weight Management: Provide education and appropriate resources to help participant work on and attain dietary goals.   Admit Weight 197 lb (89.4 kg)   Goal Weight: Short Term 195 lb (88.5 kg)   Goal Weight: Long Term 190 lb (86.2 kg)   Expected Outcomes Short Term: Continue to assess and modify interventions until short term weight is achieved;Weight Loss: Understanding of general recommendations for a balanced deficit meal plan, which promotes 1-2 lb weight loss per week and includes a negative energy balance of [phone] kcal/d;Understanding recommendations for meals to include 15-35% energy as protein, 25-35% energy from fat, 35-60% energy from carbohydrates, less than '200mg'$  of dietary cholesterol, 20-35 gm of total fiber daily;Understanding of distribution of calorie intake throughout the day with the consumption of 4-5 meals/snacks;Long Term: Adherence to nutrition and physical activity/exercise program aimed toward attainment of established weight goal   Hypertension Yes   Intervention Provide education on lifestyle modifcations including regular physical activity/exercise, weight management, moderate sodium restriction and increased consumption of fresh fruit, vegetables, and low fat dairy, alcohol moderation, and smoking cessation.;Monitor  prescription use compliance.   Expected Outcomes Short Term: Continued assessment and intervention until BP is < 140/2m HG in hypertensive participants. <  130/4m HG in hypertensive participants with diabetes, heart failure or chronic kidney disease.;Long Term: Maintenance of blood pressure at goal levels.   Lipids Yes   Intervention Provide education and support for participant on nutrition & aerobic/resistive exercise along with prescribed medications to achieve LDL '70mg'$ , HDL >'40mg'$ .   Expected Outcomes Short Term: Participant states understanding of desired cholesterol values and is compliant with medications prescribed. Participant is following exercise prescription and nutrition guidelines.;Long Term: Cholesterol controlled with medications as prescribed, with individualized exercise RX and with personalized nutrition plan. Value goals: LDL < '70mg'$ , HDL > 40 mg.      Core Components/Risk Factors/Patient Goals Review:      Goals and Risk Factor Review    Row Name 02/12/17 0756             Core Components/Risk Factors/Patient Goals Review   Personal Goals Review Weight Management/Obesity;Hypertension;Lipids       Review JIm is new to the program, working on nutrition goals, compliant with his meds and is aware of sodium intake.       Expected Outcomes Continued control of lifestyle to reach and maintian risk factor goals          Core Components/Risk Factors/Patient Goals at Discharge (Final Review):      Goals and Risk Factor Review - 02/12/17 0756      Core Components/Risk Factors/Patient Goals Review   Personal Goals Review Weight Management/Obesity;Hypertension;Lipids   Review JIm is new to the program, working on nutrition goals, compliant with his meds and is aware of sodium intake.   Expected Outcomes Continued control of lifestyle to reach and maintian risk factor goals      ITP Comments:     ITP Comments    Row Name 01/25/17 1316 02/03/17 0549 03/03/17 0546        ITP Comments ITP created during Medical Review. [facility]. Continue with ITP unless directed changes per Medical Director review 30 day review. Continue with ITP unless directed changes per Medical Director review        Comments:

## 2017-03-08 ENCOUNTER — Encounter: Payer: Medicare Other | Admitting: *Deleted

## 2017-03-08 DIAGNOSIS — I213 ST elevation (STEMI) myocardial infarction of unspecified site: Secondary | ICD-10-CM | POA: Diagnosis not present

## 2017-03-08 DIAGNOSIS — I2121 ST elevation (STEMI) myocardial infarction involving left circumflex coronary artery: Secondary | ICD-10-CM

## 2017-03-08 NOTE — Progress Notes (Signed)
Daily Session Note  Patient Details  Name: Jason Craig MRN: [id number] Date of Birth: 03-08-1941 Referring Provider:     Cardiac Rehab from 01/25/2017 in [facility] Cardiac and Pulmonary Rehab  Referring Provider  Lujean Amel MD      Encounter Date: 03/08/2017  Check In:     Session Check In - 03/08/17 8875      Check-In   Location [facility]-Cardiac & Pulmonary Rehab   Staff Present Alberteen Sam, MA, ACSM RCEP, Exercise Physiologist;Kelly Amedeo Plenty, BS, ACSM CEP, Exercise Physiologist;Carroll Enterkin, RN, BSN   Supervising physician immediately available to respond to emergencies See telemetry face sheet for immediately available ER MD   Medication changes reported     No   Fall or balance concerns reported    No   Warm-up and Cool-down Performed on first and last piece of equipment   Resistance Training Performed Yes   VAD Patient? No     Pain Assessment   Currently in Pain? No/denies   Multiple Pain Sites No         History  Smoking Status  . Former Smoker  . Types: Cigarettes  . Quit date: 11/25/1983  Smokeless Tobacco  . Never Used    Goals Met:  Independence with exercise equipment Exercise tolerated well No report of cardiac concerns or symptoms Strength training completed today  Goals Unmet:  Not Applicable  Comments: Pt able to follow exercise prescription today without complaint.  Will continue to monitor for progression.    Dr. Emily Filbert is Medical Director for [place] and [facility].

## 2017-03-10 ENCOUNTER — Encounter: Payer: Medicare Other | Attending: Internal Medicine | Admitting: *Deleted

## 2017-03-10 DIAGNOSIS — I213 ST elevation (STEMI) myocardial infarction of unspecified site: Secondary | ICD-10-CM | POA: Insufficient documentation

## 2017-03-10 DIAGNOSIS — I2121 ST elevation (STEMI) myocardial infarction involving left circumflex coronary artery: Secondary | ICD-10-CM

## 2017-03-10 NOTE — Progress Notes (Signed)
Daily Session Note  Patient Details  Name: Jason Craig MRN: [id number] Date of Birth: 04-15-1941 Referring Provider:     Cardiac Rehab from 01/25/2017 in [facility] Cardiac and Pulmonary Rehab  Referring Provider  Lujean Amel MD      Encounter Date: 03/10/2017  Check In:     Session Check In - 03/10/17 0840      Check-In   Location [facility]-Cardiac & Pulmonary Rehab   Staff Present Alberteen Sam, MA, ACSM RCEP, Exercise Physiologist;Susanne Bice, RN, BSN, Lance Sell, BA, ACSM CEP, Exercise Physiologist   Supervising physician immediately available to respond to emergencies See telemetry face sheet for immediately available ER MD   Medication changes reported     No   Fall or balance concerns reported    No   Warm-up and Cool-down Performed on first and last piece of equipment   Resistance Training Performed Yes   VAD Patient? No     Pain Assessment   Currently in Pain? No/denies   Multiple Pain Sites No         History  Smoking Status  . Former Smoker  . Types: Cigarettes  . Quit date: 11/25/1983  Smokeless Tobacco  . Never Used    Goals Met:  Independence with exercise equipment Exercise tolerated well No report of cardiac concerns or symptoms Strength training completed today  Goals Unmet:  Not Applicable  Comments: Pt able to follow exercise prescription today without complaint.  Will continue to monitor for progression.    Dr. Emily Filbert is Medical Director for [place] and [facility].

## 2017-03-12 ENCOUNTER — Encounter: Payer: Medicare Other | Admitting: *Deleted

## 2017-03-12 DIAGNOSIS — I2121 ST elevation (STEMI) myocardial infarction involving left circumflex coronary artery: Secondary | ICD-10-CM

## 2017-03-12 DIAGNOSIS — I213 ST elevation (STEMI) myocardial infarction of unspecified site: Secondary | ICD-10-CM | POA: Diagnosis not present

## 2017-03-12 NOTE — Progress Notes (Signed)
Daily Session Note  Patient Details  Name: Jason Craig MRN: [id number] Date of Birth: August 27, 1941 Referring Provider:     Cardiac Rehab from 01/25/2017 in [facility] Cardiac and Pulmonary Rehab  Referring Provider  Lujean Amel MD      Encounter Date: 03/12/2017  Check In:     Session Check In - 03/12/17 0921      Check-In   Location [facility]-Cardiac & Pulmonary Rehab   Staff Present Gerlene Burdock, RN, BSN;Susanne Bice, RN, BSN, CCRP;Jessica Luan Pulling, MA, ACSM RCEP, Exercise Physiologist   Supervising physician immediately available to respond to emergencies See telemetry face sheet for immediately available ER MD   Medication changes reported     No   Fall or balance concerns reported    No   Tobacco Cessation No Change   Warm-up and Cool-down Performed on first and last piece of equipment   Resistance Training Performed Yes   VAD Patient? No     Pain Assessment   Currently in Pain? No/denies           Exercise Prescription Changes - 03/11/17 1400      Response to Exercise   Blood Pressure (Admit) 124/70   Blood Pressure (Exercise) 140/64   Blood Pressure (Exit) 130/64   Heart Rate (Admit) 66 bpm   Heart Rate (Exercise) 134 bpm   Heart Rate (Exit) 58 bpm   Rating of Perceived Exertion (Exercise) 12   Symptoms none   Duration Continue with 45 min of aerobic exercise without signs/symptoms of physical distress.   Intensity THRR unchanged     Progression   Progression Continue to progress workloads to maintain intensity without signs/symptoms of physical distress.   Average METs 5.27     Resistance Training   Training Prescription Yes   Weight 5 lbs   Reps 10-15     Interval Training   Interval Training No     Treadmill   MPH 4   Grade 4   Minutes 15   METs 7.1     NuStep   Level 7   Minutes 15   METs 4     REL-XR   Level 5   Minutes 15   METs 4.3     Home Exercise Plan   Plans to continue exercise at Home (comment)  walking   Frequency Add 3  additional days to program exercise sessions.   Initial Home Exercises Provided 02/08/17      History  Smoking Status  . Former Smoker  . Types: Cigarettes  . Quit date: 11/25/1983  Smokeless Tobacco  . Never Used    Goals Met:  Proper associated with RPD/PD & O2 Sat Exercise tolerated well No report of cardiac concerns or symptoms  Goals Unmet:  Not Applicable  Comments:     Dr. Emily Filbert is Medical Director for [place] and [facility].

## 2017-03-15 ENCOUNTER — Encounter: Payer: Medicare Other | Admitting: *Deleted

## 2017-03-15 DIAGNOSIS — I2121 ST elevation (STEMI) myocardial infarction involving left circumflex coronary artery: Secondary | ICD-10-CM

## 2017-03-15 DIAGNOSIS — I213 ST elevation (STEMI) myocardial infarction of unspecified site: Secondary | ICD-10-CM | POA: Diagnosis not present

## 2017-03-15 NOTE — Progress Notes (Signed)
Daily Session Note  Patient Details  Name: Jason Craig MRN: [id number] Date of Birth: 11/13/1940 Referring Provider:     Cardiac Rehab from 01/25/2017 in [facility] Cardiac and Pulmonary Rehab  Referring Provider  Callwood, Dwayne MD      Encounter Date: 03/15/2017  Check In:     Session Check In - 03/15/17 0846      Check-In   Location [facility]-Cardiac & Pulmonary Rehab   Staff Present Jessica Hawkins, MA, ACSM RCEP, Exercise Physiologist;Kelly Hayes, BS, ACSM CEP, Exercise Physiologist;Susanne Bice, RN, BSN, CCRP   Supervising physician immediately available to respond to emergencies See telemetry face sheet for immediately available ER MD   Medication changes reported     No   Fall or balance concerns reported    No   Warm-up and Cool-down Performed on first and last piece of equipment   Resistance Training Performed Yes   VAD Patient? No     Pain Assessment   Currently in Pain? No/denies   Multiple Pain Sites No         History  Smoking Status  . Former Smoker  . Types: Cigarettes  . Quit date: 11/25/1983  Smokeless Tobacco  . Never Used    Goals Met:  Independence with exercise equipment Exercise tolerated well No report of cardiac concerns or symptoms Strength training completed today  Goals Unmet:  Not Applicable  Comments: Pt able to follow exercise prescription today without complaint.  Will continue to monitor for progression.    Dr. Mark Miller is Medical Director for [facility] and [facility]. 

## 2017-03-19 ENCOUNTER — Encounter: Payer: Medicare Other | Admitting: *Deleted

## 2017-03-19 VITALS — Ht 72.3 in | Wt 193.0 lb

## 2017-03-19 DIAGNOSIS — I2121 ST elevation (STEMI) myocardial infarction involving left circumflex coronary artery: Secondary | ICD-10-CM

## 2017-03-19 DIAGNOSIS — I213 ST elevation (STEMI) myocardial infarction of unspecified site: Secondary | ICD-10-CM | POA: Diagnosis not present

## 2017-03-19 NOTE — Progress Notes (Signed)
Daily Session Note  Patient Details  Name: Jason Craig MRN: [id number] Date of Birth: 1941-10-03 Referring Provider:     Cardiac Rehab from 01/25/2017 in [facility] Cardiac and Pulmonary Rehab  Referring Provider  Lujean Amel MD      Encounter Date: 03/19/2017  Check In:     Session Check In - 03/19/17 0859      Check-In   Location [facility]-Cardiac & Pulmonary Rehab   Staff Present Gerlene Burdock, RN, BSN;Susanne Bice, RN, BSN, CCRP;Jessica Luan Pulling, MA, ACSM RCEP, Exercise Physiologist   Supervising physician immediately available to respond to emergencies See telemetry face sheet for immediately available ER MD   Medication changes reported     No   Fall or balance concerns reported    No   Tobacco Cessation No Change   Warm-up and Cool-down Performed on first and last piece of equipment   Resistance Training Performed Yes   VAD Patient? No     Pain Assessment   Currently in Pain? No/denies         History  Smoking Status  . Former Smoker  . Types: Cigarettes  . Quit date: 11/25/1983  Smokeless Tobacco  . Never Used    Goals Met:  Proper associated with RPD/PD & O2 Sat Exercise tolerated well No report of cardiac concerns or symptoms  Goals Unmet:  Not Applicable  Comments:     Dr. Emily Filbert is Medical Director for [place] and [facility].

## 2017-03-22 ENCOUNTER — Encounter: Payer: Medicare Other | Admitting: *Deleted

## 2017-03-22 DIAGNOSIS — I2121 ST elevation (STEMI) myocardial infarction involving left circumflex coronary artery: Secondary | ICD-10-CM

## 2017-03-22 DIAGNOSIS — I213 ST elevation (STEMI) myocardial infarction of unspecified site: Secondary | ICD-10-CM | POA: Diagnosis not present

## 2017-03-22 NOTE — Patient Instructions (Signed)
Discharge Instructions  Patient Details  Name: Jason Craig MRN: [id number] Date of Birth: 1941-05-12 Referring Provider:  Yolonda Kida, MD   Number of Visits: 36/36  Reason for Discharge:  Patient reached a stable level of exercise. Patient independent in their exercise.  Smoking History:  History  Smoking Status  . Former Smoker  . Types: Cigarettes  . Quit date: 11/25/1983  Smokeless Tobacco  . 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 [facility])  Initial Exercise Prescription:     Initial Exercise Prescription - 01/25/17 1400      Date of Initial Exercise RX and Referring Provider   Date 01/25/17   Referring Provider Lujean Amel MD     Treadmill   MPH 3.5   Grade 0.5   Minutes 15   METs 3.68     NuStep   Level 3   SPM 100   Minutes 15   METs 3     REL-XR   Level 3   Speed 50   Minutes 15   METs 3     Prescription Details   Frequency (times per week) 3   Duration Progress to 45 minutes of aerobic exercise without signs/symptoms of physical distress     Intensity   THRR 40-80% of Max Heartrate 90-125   Ratings of Perceived Exertion 11-13   Perceived Dyspnea 0-4     Progression   Progression Continue to progress workloads to maintain intensity without signs/symptoms of physical distress.     Resistance Training   Training Prescription Yes   Weight 3 lbs      Discharge Exercise Prescription (Final Exercise Prescription Changes):     Exercise Prescription Changes - 03/22/17 1600      Response to Exercise   Blood Pressure (Admit) 138/82   Blood Pressure (Exercise) 134/60   Blood Pressure (Exit) 124/60   Heart Rate (Admit) 56 bpm   Heart Rate (Exercise) 113 bpm   Heart Rate (Exit) 82 bpm   Rating of Perceived Exertion (Exercise) 12   Symptoms none   Duration Continue with 45 min of aerobic exercise without signs/symptoms of physical distress.   Intensity THRR unchanged      Progression   Progression Continue to progress workloads to maintain intensity without signs/symptoms of physical distress.   Average METs 6.81     Resistance Training   Training Prescription Yes   Weight 5 lbs   Reps 10-15     Interval Training   Interval Training Yes   Equipment Recumbant Elliptical;NuStep   Comments 30 sec on     Treadmill   MPH 4   Grade 4   Minutes 15   METs 6.27     NuStep   Level 7   Minutes 15   METs 4.3     REL-XR   Level 5   Minutes 15   METs 10     Home Exercise Plan   Plans to continue exercise at Home (comment)  walking   Frequency Add 3 additional days to program exercise sessions.   Initial Home Exercises Provided 02/08/17      Functional Capacity:     6 Minute Walk    Row Name 01/25/17 1426 03/19/17 0939       6 Minute Walk   Phase Initial Discharge    Distance 1870 feet 2170 feet    Distance % Change  - 16 %  300 ft  Walk Time 6 minutes 6 minutes    # of Rest Breaks 0 0    MPH 3.54 4.11    METS 3.95 4.53    RPE 9 12    VO2 Peak 13.82 15.85    Symptoms No No    Resting HR 54 bpm 75 bpm    Resting BP 116/64 138/70    Max Ex. HR 127 bpm 120 bpm    Max Ex. BP 126/64 144/60    2 Minute Post BP 122/60  -       Quality of Life:     Quality of Life - 03/19/17 0941      Quality of Life Scores   Health/Function Pre 30 %   Health/Function Post 29.2 %   Health/Function % Change -2.67 %   Socioeconomic Pre 30 %   Socioeconomic Post 30 %   Socioeconomic % Change  0 %   Psych/Spiritual Pre 30 %   Psych/Spiritual Post 30 %   Psych/Spiritual % Change 0 %   Family Pre 30 %   Family Post 30 %   Family % Change 0 %   GLOBAL Pre 30 %   GLOBAL Post 29.65 %   GLOBAL % Change -1.17 %      Personal Goals: Goals established at orientation with interventions provided to work toward goal.     Personal Goals and Risk Factors at Admission - 01/25/17 1318      Core Components/Risk Factors/Patient Goals on  Admission    Weight Management Weight Loss;Yes   Intervention Weight Management: Develop a combined nutrition and exercise program designed to reach desired caloric intake, while maintaining appropriate intake of nutrient and fiber, sodium and fats, and appropriate energy expenditure required for the weight goal.;Weight Management: Provide education and appropriate resources to help participant work on and attain dietary goals.   Admit Weight 197 lb (89.4 kg)   Goal Weight: Short Term 195 lb (88.5 kg)   Goal Weight: Long Term 190 lb (86.2 kg)   Expected Outcomes Short Term: Continue to assess and modify interventions until short term weight is achieved;Weight Loss: Understanding of general recommendations for a balanced deficit meal plan, which promotes 1-2 lb weight loss per week and includes a negative energy balance of [phone] kcal/d;Understanding recommendations for meals to include 15-35% energy as protein, 25-35% energy from fat, 35-60% energy from carbohydrates, less than 200mg  of dietary cholesterol, 20-35 gm of total fiber daily;Understanding of distribution of calorie intake throughout the day with the consumption of 4-5 meals/snacks;Long Term: Adherence to nutrition and physical activity/exercise program aimed toward attainment of established weight goal   Hypertension Yes   Intervention Provide education on lifestyle modifcations including regular physical activity/exercise, weight management, moderate sodium restriction and increased consumption of fresh fruit, vegetables, and low fat dairy, alcohol moderation, and smoking cessation.;Monitor prescription use compliance.   Expected Outcomes Short Term: Continued assessment and intervention until BP is < 140/50mm HG in hypertensive participants. < 130/1mm HG in hypertensive participants with diabetes, heart failure or chronic kidney disease.;Long Term: Maintenance of blood pressure at goal levels.   Lipids Yes   Intervention Provide education  and support for participant on nutrition & aerobic/resistive exercise along with prescribed medications to achieve LDL 70mg , HDL >40mg .   Expected Outcomes Short Term: Participant states understanding of desired cholesterol values and is compliant with medications prescribed. Participant is following exercise prescription and nutrition guidelines.;Long Term: Cholesterol controlled with medications as prescribed, with individualized exercise RX and with  personalized nutrition plan. Value goals: LDL < 70mg , HDL > 40 mg.       Personal Goals Discharge:     Goals and Risk Factor Review - 02/12/17 0756      Core Components/Risk Factors/Patient Goals Review   Personal Goals Review Weight Management/Obesity;Hypertension;Lipids   Review JIm is new to the program, working on nutrition goals, compliant with his meds and is aware of sodium intake.   Expected Outcomes Continued control of lifestyle to reach and maintian risk factor goals      Nutrition & Weight - Outcomes:     Pre Biometrics - 03/19/17 0947      Pre Biometrics   Height 6' 0.3" (1.836 m)   Weight 193 lb (87.5 kg)   Waist Circumference 40 inches   Hip Circumference 38.5 inches   Waist to Hip Ratio 1.04 %   BMI (Calculated) 26   Single Leg Stand 26.51 seconds       Nutrition:     Nutrition Therapy & Goals - 02/12/17 0751      Personal Nutrition Goals   Nutrition Goal Continue with current eating pattern   Personal Goal #2 ideally include protein food with breakfast and lunch as well as supper.      Nutrition Discharge:     Nutrition Assessments - 03/19/17 0941      MEDFICTS Scores   Pre Score 49   Post Score 39   Score Difference -10      Education Questionnaire Score:     Knowledge Questionnaire Score - 03/19/17 0942      Knowledge Questionnaire Score   Pre Score 24/28   Post Score 27/28      Goals reviewed with patient; copy given to patient.

## 2017-03-22 NOTE — Progress Notes (Signed)
Daily Session Note  Patient Details  Name: TYEE VANDEVOORDE MRN: [id number] Date of Birth: 1941-10-07 Referring Provider:     Cardiac Rehab from 01/25/2017 in [facility] Cardiac and Pulmonary Rehab  Referring Provider  Lujean Amel MD      Encounter Date: 03/22/2017  Check In:     Session Check In - 03/22/17 0817      Check-In   Location [facility]-Cardiac & Pulmonary Rehab   Staff Present Earlean Shawl, BS, ACSM CEP, Exercise Physiologist;Jessica Luan Pulling, MA, ACSM RCEP, Exercise Physiologist;Carroll Enterkin, RN, BSN   Supervising physician immediately available to respond to emergencies See telemetry face sheet for immediately available ER MD   Medication changes reported     No   Fall or balance concerns reported    No   Warm-up and Cool-down Performed on first and last piece of equipment   Resistance Training Performed Yes   VAD Patient? No     Pain Assessment   Currently in Pain? No/denies   Multiple Pain Sites No         History  Smoking Status  . Former Smoker  . Types: Cigarettes  . Quit date: 11/25/1983  Smokeless Tobacco  . Never Used    Goals Met:  Independence with exercise equipment Exercise tolerated well No report of cardiac concerns or symptoms Strength training completed today  Goals Unmet:  Not Applicable  Comments: Pt able to follow exercise prescription today without complaint.  Will continue to monitor for progression.    Dr. Emily Filbert is Medical Director for [place] and [facility].

## 2017-03-24 DIAGNOSIS — I2121 ST elevation (STEMI) myocardial infarction involving left circumflex coronary artery: Secondary | ICD-10-CM

## 2017-03-24 DIAGNOSIS — I213 ST elevation (STEMI) myocardial infarction of unspecified site: Secondary | ICD-10-CM | POA: Diagnosis not present

## 2017-03-24 NOTE — Progress Notes (Signed)
Daily Session Note  Patient Details  Name: Jason Craig MRN: [id number] Date of Birth: 05/19/1941 Referring Provider:     Cardiac Rehab from 01/25/2017 in [facility] Cardiac and Pulmonary Rehab  Referring Provider  Lujean Amel MD      Encounter Date: 03/24/2017  Check In:     Session Check In - 03/24/17 0806      Check-In   Location [facility]-Cardiac & Pulmonary Rehab   Staff Present Heath Lark, RN, BSN, CCRP;Jessica Cable, MA, ACSM RCEP, Exercise Physiologist;Mindy Gali Oletta Darter, BA, ACSM CEP, Exercise Physiologist   Supervising physician immediately available to respond to emergencies See telemetry face sheet for immediately available ER MD   Medication changes reported     No   Fall or balance concerns reported    No   Warm-up and Cool-down Performed on first and last piece of equipment   Resistance Training Performed Yes   VAD Patient? No     Pain Assessment   Currently in Pain? No/denies         History  Smoking Status  . Former Smoker  . Types: Cigarettes  . Quit date: 11/25/1983  Smokeless Tobacco  . Never Used    Goals Met:  Independence with exercise equipment Exercise tolerated well No report of cardiac concerns or symptoms Strength training completed today  Goals Unmet:  Not Applicable  Comments: Pt able to follow exercise prescription today without complaint.  Will continue to monitor for progression.    Dr. Emily Filbert is Medical Director for [place] and [facility].

## 2017-03-26 ENCOUNTER — Encounter: Payer: Medicare Other | Admitting: *Deleted

## 2017-03-26 DIAGNOSIS — I213 ST elevation (STEMI) myocardial infarction of unspecified site: Secondary | ICD-10-CM | POA: Diagnosis not present

## 2017-03-26 DIAGNOSIS — I2121 ST elevation (STEMI) myocardial infarction involving left circumflex coronary artery: Secondary | ICD-10-CM

## 2017-03-26 NOTE — Progress Notes (Signed)
Cardiac Individual Treatment Plan  Patient Details  Name: Jason Craig MRN: [id number] Date of Birth: [birth year] Referring Provider:     Cardiac Rehab from 01/25/2017 in [facility] Re[facility]  Referring Provider  Lujean Amel MD      Initial Encounter Date:    Cardiac Rehab from 01/25/2017 in [facility] Cardiac and Pulmonary Rehab  Date  01/25/17  Referring Provider  Lujean Amel MD      Visit Diagnosis: No diagnosis found.  Patient's Home Medications on Admission:  Current Outpatient Prescriptions:  .  aspirin EC 81 MG EC tablet, Take 1 tablet (81 mg total) by mouth daily., Disp: 30 tablet, Rfl: 0 .  atorvastatin (LIPITOR) 40 MG tablet, Take 1 tablet (40 mg total) by mouth daily at 6 PM., Disp: 90 tablet, Rfl: 1 .  Calcium Citrate-Vitamin D (CALCIUM + D PO), Take 1 tablet by mouth daily., Disp: , Rfl:  .  clopidogrel (PLAVIX) 75 MG tablet, Take 1 tablet (75 mg total) by mouth daily with breakfast., Disp: 30 tablet, Rfl: 0 .  Coenzyme Q10 (COQ10 PO), Take 1 capsule by mouth daily., Disp: , Rfl:  .  latanoprost (XALATAN) 0.005 % ophthalmic solution, Place 1 drop into both eyes at bedtime., Disp: , Rfl: 0 .  losartan (COZAAR) 25 MG tablet, Take 25 mg by mouth daily., Disp: , Rfl:  .  metoprolol tartrate (LOPRESSOR) 50 MG tablet, Take 0.5 tablets (25 mg total) by mouth 2 (two) times daily., Disp: 60 tablet, Rfl: 0 .  Multiple Vitamin (MULTIVITAMIN WITH MINERALS) TABS tablet, Take 1 tablet by mouth daily., Disp: , Rfl:  .  multivitamin-lutein (OCUVITE-LUTEIN) CAPS capsule, Take 1 capsule by mouth daily., Disp: , Rfl:  .  Omega-3 Fatty Acids (FISH OIL) 1000 MG CAPS, Take 1 capsule (1,000 mg total) by mouth daily., Disp: 30 capsule, Rfl: 0 .  Omega-3 Fatty Acids (SALMON OIL-1000 PO), Take 2,000 mg by mouth., Disp: , Rfl:  .  Probiotic Product (PROBIOTIC PO), Take 1 capsule by mouth daily., Disp: , Rfl:   Past Medical History: Past Medical History:  Diagnosis Date  .  Hypertension   . 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 Rib fracture 07/11/2015   Horse Accident     Tobacco Use: History  Smoking Status  . Former Smoker  . Types: Cigarettes  . Quit date: 11/25/1983  Smokeless Tobacco  . Never Used    Labs: Recent Review Flowsheet Data    Labs for ITP Cardiac and Pulmonary Rehab Latest Ref Rng & Units 12/27/2016 02/25/2017   Cholestrol <200 mg/dL 91 75   LDLCALC <100 mg/dL 39 22   HDL >40 mg/dL 29(L) 38(L)   Trlycerides <150 mg/dL 117 76   Hemoglobin A1c 4.8 - 5.6 % 5.7(H) -       Exercise Target Goals:    Exercise Program Goal: Individual exercise prescription set with THRR, safety & activity barriers. Participant demonstrates ability to understand and report RPE using BORG scale, to self-measure pulse accurately, and to acknowledge the importance of the exercise prescription.  Exercise Prescription Goal: Starting with aerobic activity 30 plus minutes a day, 3 days per week for initial exercise prescription. Provide home exercise prescription and guidelines that participant acknowledges understanding prior to discharge.  Activity Barriers & Risk Stratification:     Activity Barriers & Cardiac Risk Stratification - 01/25/17 1318      Activity Barriers & Cardiac Risk Stratification  Activity Barriers None   Cardiac Risk Stratification High      6 Minute Walk:     6 Minute Walk    Row Name 01/25/17 1426 03/19/17 0939       6 Minute Walk   Phase Initial Discharge    Distance 1870 feet 2170 feet    Distance % Change  - 16 %  300 ft    Walk Time 6 minutes 6 minutes    # of Rest Breaks 0 0    MPH 3.54 4.11    METS 3.95 4.53    RPE 9 12    VO2 Peak 13.82 15.85    Symptoms No No    Resting HR 54 bpm 75 bpm    Resting BP 116/64 138/70    Max Ex. HR 127 bpm 120 bpm    Max Ex. BP 126/64 144/60    2 Minute Post BP 122/60  -       Oxygen Initial Assessment:   Oxygen  Re-Evaluation:   Oxygen Discharge (Final Oxygen Re-Evaluation):   Initial Exercise Prescription:     Initial Exercise Prescription - 01/25/17 1400      Date of Initial Exercise RX and Referring Provider   Date 01/25/17   Referring Provider Lujean Amel MD     Treadmill   MPH 3.5   Grade 0.5   Minutes 15   METs 3.68     NuStep   Level 3   SPM 100   Minutes 15   METs 3     REL-XR   Level 3   Speed 50   Minutes 15   METs 3     Prescription Details   Frequency (times per week) 3   Duration Progress to 45 minutes of aerobic exercise without signs/symptoms of physical distress     Intensity   THRR 40-80% of Max Heartrate 90-125   Ratings of Perceived Exertion 11-13   Perceived Dyspnea 0-4     Progression   Progression Continue to progress workloads to maintain intensity without signs/symptoms of physical distress.     Resistance Training   Training Prescription Yes   Weight 3 lbs      Perform Capillary Blood Glucose checks as needed.  Exercise Prescription Changes:     Exercise Prescription Changes    Row Name 01/25/17 1400 02/08/17 1000 02/10/17 1600 02/25/17 1200 03/11/17 1400     Response to Exercise   Blood Pressure (Admit) 116/64  - 106/54 138/62 124/70   Blood Pressure (Exercise) 126/64  - 144/60 136/70 140/64   Blood Pressure (Exit) 122/60  - 124/68 112/60 130/64   Heart Rate (Admit) 54 bpm  - 74 bpm 49 bpm 66 bpm   Heart Rate (Exercise) 127 bpm  - 105 bpm 98 bpm 134 bpm   Heart Rate (Exit) 58 bpm  - 54 bpm 64 bpm 58 bpm   Oxygen Saturation (Admit) 99 %  -  -  -  -   Oxygen Saturation (Exercise) 98 %  -  -  -  -   Rating of Perceived Exertion (Exercise) 9  - '12 12 12   ' Symptoms none none none none none   Comments walk test results  -  -  -  -   Duration  -  - Continue with 45 min of aerobic exercise without signs/symptoms of physical distress. Continue with 45 min of aerobic exercise without signs/symptoms of physical distress. Continue with  45 min of aerobic exercise  without signs/symptoms of physical distress.   Intensity  -  - THRR unchanged THRR unchanged THRR unchanged     Progression   Progression  -  - Continue to progress workloads to maintain intensity without signs/symptoms of physical distress. Continue to progress workloads to maintain intensity without signs/symptoms of physical distress. Continue to progress workloads to maintain intensity without signs/symptoms of physical distress.   Average METs  -  - 3.97 4.87 5.27     Resistance Training   Training Prescription  - Yes Yes Yes Yes   Weight  - 3 lbs 3 lbs 4 lbs 5 lbs   Reps  -  - 10-15 10-15 10-15     Interval Training   Interval Training  -  - No No No     Treadmill   MPH  - 3.5 3.5 3.8 4   Grade  - 0.5 0.'5 2 4   ' Minutes  - '15 15 15 15   ' METs  - 3.68 3.92 4.96 7.1     NuStep   Level  - '3 6 7 7   ' SPM  - 100 100 100  -   Minutes  - '15 15 15 15   ' METs  - 3 4 5.1 4     REL-XR   Level  - 3 4 4.5 5   Speed  - 50  -  -  -   Minutes  - '15 15 15 15   ' METs  - 3 4 3.4 4.3     Home Exercise Plan   Plans to continue exercise at  - Home (comment)  walking Home (comment)  walking Home (comment)  walking Home (comment)  walking   Frequency  - Add 3 additional days to program exercise sessions. Add 3 additional days to program exercise sessions. Add 3 additional days to program exercise sessions. Add 3 additional days to program exercise sessions.   Initial Home Exercises Provided  - 02/08/17 02/08/17 02/08/17 02/08/17   Row Name 03/22/17 1600             Response to Exercise   Blood Pressure (Admit) 138/82       Blood Pressure (Exercise) 134/60       Blood Pressure (Exit) 124/60       Heart Rate (Admit) 56 bpm       Heart Rate (Exercise) 113 bpm       Heart Rate (Exit) 82 bpm       Rating of Perceived Exertion (Exercise) 12       Symptoms none       Duration Continue with 45 min of aerobic exercise without signs/symptoms of physical distress.        Intensity THRR unchanged         Progression   Progression Continue to progress workloads to maintain intensity without signs/symptoms of physical distress.       Average METs 6.81         Resistance Training   Training Prescription Yes       Weight 5 lbs       Reps 10-15         Interval Training   Interval Training Yes       Equipment Recumbant Elliptical;NuStep       Comments 30 sec on         Treadmill   MPH 4       Grade 4       Minutes 15  METs 6.27         NuStep   Level 7       Minutes 15       METs 4.3         REL-XR   Level 5       Minutes 15       METs 10         Home Exercise Plan   Plans to continue exercise at Home (comment)  walking       Frequency Add 3 additional days to program exercise sessions.       Initial Home Exercises Provided 02/08/17          Exercise Comments:     Exercise Comments    Row Name 01/29/17 0756 02/03/17 0915 02/12/17 0846 03/01/17 0840 03/15/17 9166   Exercise Comments First full day of exercise!  Patient was oriented to gym and equipment including functions, settings, policies, and procedures.  Patient's individual exercise prescription and treatment plan were reviewed.  All starting workloads were established based on the results of the 6 minute walk test done at initial orientation visit.  The plan for exercise progression was also introduced and progression will be customized based on patient's performance and goals. Switched from XR to REL permanently due to availability. Reviewed METs average and discussed progression with pt today. Reviewed METs average and discussed progression with pt today. Reviewed METs average and discussed progression with pt today.      Exercise Goals and Review:     Exercise Goals    Row Name 01/25/17 1430             Exercise Goals   Increase Physical Activity Yes       Intervention Provide advice, education, support and counseling about physical activity/exercise  needs.;Develop an individualized exercise prescription for aerobic and resistive training based on initial evaluation findings, risk stratification, comorbidities and participant's personal goals.       Expected Outcomes Achievement of increased cardiorespiratory fitness and enhanced flexibility, muscular endurance and strength shown through measurements of functional capacity and personal statement of participant.       Increase Strength and Stamina Yes       Intervention Provide advice, education, support and counseling about physical activity/exercise needs.;Develop an individualized exercise prescription for aerobic and resistive training based on initial evaluation findings, risk stratification, comorbidities and participant's personal goals.       Expected Outcomes Achievement of increased cardiorespiratory fitness and enhanced flexibility, muscular endurance and strength shown through measurements of functional capacity and personal statement of participant.          Exercise Goals Re-Evaluation :     Exercise Goals Re-Evaluation    Row Name 02/08/17 1020 02/10/17 1613 02/25/17 1208 03/11/17 1445 03/19/17 0940     Exercise Goal Re-Evaluation   Exercise Goals Review Increase Physical Activity;Increase Strenth and Stamina Increase Physical Activity;Increase Strenth and Stamina Increase Physical Activity;Increase Strenth and Stamina Increase Physical Activity;Increase Strenth and Stamina Increase Physical Activity;Increase Strenth and Stamina   Comments Reviewed home exercise with pt today.  Pt plans to continue walking at home for exercise.  Reviewed THR, pulse, RPE, sign and symptoms, and when to call 911 or MD.  Also discussed weather considerations and indoor options.  Pt voiced understanding. Jason Craig is off to a good start with rehab.  He is enjoying the REL.  He is now up to level four on both the REL and NuStep.  We will continue to monitor  his progression. Jason Craig has been doing well in  rehab.  He is now up to level 7 on the NuStep.  We will continue to monitor his progression. Jason Craig continues to do well in rehab.  He is now doing 4 mph with a 4% incline on the treadmill!  He is also up to level 5 on the recumbent elliptical!  We will continue to monitor his progression. Jason Craig improved his walk test by 16%!!   Expected Outcomes Short and Long: Continue to walk at home for exercise to increase strength and stamina Short: Jason Craig will continue to increase workloads and add more incline on treadmill.  Long: Continue to come to class to work on strength and stamina. Short: We will discuss adding in interval training.  Long: Continue to work on IT sales professional. Short: Continue to encourage Jason Craig to try the intervals versus just moving up workloads.  Long: Continue to make exercise part of his routine.  -   Row Name 03/22/17 1607             Exercise Goal Re-Evaluation   Exercise Goals Review Increase Physical Activity;Increase Strenth and Stamina       Comments Jason Craig will be graduating on Friday!  He has started some intervals on the REL and the NuStep, just not with a consistent time in between.  He has already started going to the gym on his off days and plans to continue daily!       Expected Outcomes Short: Jason Craig will graduate!  Long: Continue to exercise daily at the gym!          Discharge Exercise Prescription (Final Exercise Prescription Changes):     Exercise Prescription Changes - 03/22/17 1600      Response to Exercise   Blood Pressure (Admit) 138/82   Blood Pressure (Exercise) 134/60   Blood Pressure (Exit) 124/60   Heart Rate (Admit) 56 bpm   Heart Rate (Exercise) 113 bpm   Heart Rate (Exit) 82 bpm   Rating of Perceived Exertion (Exercise) 12   Symptoms none   Duration Continue with 45 min of aerobic exercise without signs/symptoms of physical distress.   Intensity THRR unchanged     Progression   Progression Continue to progress workloads to maintain  intensity without signs/symptoms of physical distress.   Average METs 6.81     Resistance Training   Training Prescription Yes   Weight 5 lbs   Reps 10-15     Interval Training   Interval Training Yes   Equipment Recumbant Elliptical;NuStep   Comments 30 sec on     Treadmill   MPH 4   Grade 4   Minutes 15   METs 6.27     NuStep   Level 7   Minutes 15   METs 4.3     REL-XR   Level 5   Minutes 15   METs 10     Home Exercise Plan   Plans to continue exercise at Home (comment)  walking   Frequency Add 3 additional days to program exercise sessions.   Initial Home Exercises Provided 02/08/17      Nutrition:  Target Goals: Understanding of nutrition guidelines, daily intake of sodium <1522m, cholesterol <2077m calories 30% from fat and 7% or less from saturated fats, daily to have 5 or more servings of fruits and vegetables.  Biometrics:     Pre Biometrics - 03/19/17 0947      Pre Biometrics   Height 6' 0.3" (1.836 m)  Weight 193 lb (87.5 kg)   Waist Circumference 40 inches   Hip Circumference 38.5 inches   Waist to Hip Ratio 1.04 %   BMI (Calculated) 26   Single Leg Stand 26.51 seconds       Nutrition Therapy Plan and Nutrition Goals:     Nutrition Therapy & Goals - 02/12/17 0751      Personal Nutrition Goals   Nutrition Goal Continue with current eating pattern   Personal Goal #2 ideally include protein food with breakfast and lunch as well as supper.      Nutrition Discharge: Rate Your Plate Scores:     Nutrition Assessments - 03/19/17 0941      MEDFICTS Scores   Pre Score 49   Post Score 39   Score Difference -10      Nutrition Goals Re-Evaluation:     Nutrition Goals Re-Evaluation    Row Name 02/12/17 0753 03/26/17 0803           Goals   Current Weight  - 193 lb (87.5 kg)      Nutrition Goal Continue with current eating pattern  -      Comment Continues to eat as has been, adding some more meat to meals.  Stated may try  protein drink during day since does not eat protein item at breakfast Has added a protein to his breakfast most morningt.      Expected Outcome Continue with nutrition plan as is with more protein by more meat selection or use of the protein drink  -        Personal Goal #2 Re-Evaluation   Personal Goal #2 ideally include protein food with breakfast and lunch as well as supper. Has increased his protein intake.         Nutrition Goals Discharge (Final Nutrition Goals Re-Evaluation):     Nutrition Goals Re-Evaluation - 03/26/17 0803      Goals   Current Weight 193 lb (87.5 kg)   Comment Has added a protein to his breakfast most morningt.     Personal Goal #2 Re-Evaluation   Personal Goal #2 Has increased his protein intake.      Psychosocial: Target Goals: Acknowledge presence or absence of significant depression and/or stress, maximize coping skills, provide positive support system. Participant is able to verbalize types and ability to use techniques and skills needed for reducing stress and depression.   Initial Review & Psychosocial Screening:     Initial Psych Review & Screening - 01/25/17 1211      Initial Review   Current issues with None Identified     Family Dynamics   Good Support System? Yes     Barriers   Psychosocial barriers to participate in program There are no identifiable barriers or psychosocial needs.     Screening Interventions   Interventions Encouraged to exercise      Quality of Life Scores:      Quality of Life - 03/19/17 0941      Quality of Life Scores   Health/Function Pre 30 %   Health/Function Post 29.2 %   Health/Function % Change -2.67 %   Socioeconomic Pre 30 %   Socioeconomic Post 30 %   Socioeconomic % Change  0 %   Psych/Spiritual Pre 30 %   Psych/Spiritual Post 30 %   Psych/Spiritual % Change 0 %   Family Pre 30 %   Family Post 30 %   Family % Change 0 %   GLOBAL Pre  30 %   GLOBAL Post 29.65 %   GLOBAL % Change -1.17 %       PHQ-9: Recent Review Flowsheet Data    Depression screen [facility] 2/9 03/19/2017 02/25/2017 01/25/2017 08/27/2016 08/22/2015   Decreased Interest 0 0 0 0 0   Down, Depressed, Hopeless 0 0 0 0 0   PHQ - 2 Score 0 0 0 0 0   Altered sleeping 0 - 0 - -   Tired, decreased energy 0 - 0 - -   Change in appetite 0 - 0 - -   Feeling bad or failure about yourself  0 - 0 - -   Trouble concentrating 0 - 0 - -   Moving slowly or fidgety/restless 0 - 0 - -   Suicidal thoughts 0 - 0 - -   PHQ-9 Score 0 - 0 - -     Interpretation of Total Score  Total Score Depression Severity:  1-4 = Minimal depression, 5-9 = Mild depression, 10-14 = Moderate depression, 15-19 = Moderately severe depression, 20-27 = Severe depression   Psychosocial Evaluation and Intervention:     Psychosocial Evaluation - 03/26/17 0748      Psychosocial Evaluation & Interventions   Interventions Therapist referral      Psychosocial Re-Evaluation:     Psychosocial Re-Evaluation    Row Name 02/12/17 0755 03/26/17 0759           Psychosocial Re-Evaluation   Current issues with None Identified  -      Comments  - Jason Craig "Jason Craig' said he has "really enjoyed this (Cardiac Rehab) and 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 Mickle said he does not feel he has any stress since he has realized somethings like having a heart attack is out of his control but he knows to keep exercising.      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 Jason Craig said he does not feel he has any  stress since he has realized somethings like having a heart attack is out of his control but he knows to keep exercising.      Vocational Rehabilitation: Provide vocational rehab assistance to qualifying candidates.   Vocational Rehab Evaluation & Intervention:     Vocational Rehab - 01/25/17 1316      Initial Vocational Rehab Evaluation & Intervention   Assessment shows need for Vocational Rehabilitation No      Education: Education Goals: Education classes will be provided on a weekly basis, covering required topics. Participant will state understanding/return demonstration of topics presented.  Learning Barriers/Preferences:     Learning Barriers/Preferences - 01/25/17 1316      Learning Barriers/Preferences   Learning Barriers None   Learning Preferences Individual Instruction      Education Topics: General Nutrition Guidelines/Fats and Fiber: -Group instruction provided by verbal, written material, models and posters to present the general guidelines for heart healthy nutrition. Gives an explanation and review of dietary fats and fiber.   Cardiac Rehab from 03/24/2017 in  [place] Cardiac and Pulmonary Rehab  Date  03/08/17  Educator  CR  Instruction Review Code  2- meets goals/outcomes      Controlling Sodium/Reading Food Labels: -Group verbal and written material supporting the discussion of sodium use in heart healthy nutrition. Review and explanation with models, verbal and written materials for utilization of the food label.   Cardiac Rehab from 03/24/2017 in [facility] Cardiac and Pulmonary Rehab  Date  03/15/17  Educator  CR  Instruction Review Code  2- meets goals/outcomes      Exercise Physiology & Risk Factors: - Group verbal and written instruction with models to review the exercise physiology of the cardiovascular system and associated critical values. Details cardiovascular disease risk factors and the goals associated with each risk factor.   Cardiac Rehab  from 03/24/2017 in [facility] Cardiac and Pulmonary Rehab  Date  03/22/17  Educator  [facility]  Instruction Review Code  2- meets goals/outcomes      Aerobic Exercise & Resistance Training: - Gives group verbal and written discussion on the health impact of inactivity. On the components of aerobic and resistive training programs and the benefits of this training and how to safely progress through these programs.   Cardiac Rehab from 03/24/2017 in [facility] Cardiac and Pulmonary Rehab  Date  03/24/17  Educator  [facility]  Instruction Review Code  2- meets goals/outcomes      Flexibility, Balance, General Exercise Guidelines: - Provides group verbal and written instruction on the benefits of flexibility and balance training programs. Provides general exercise guidelines with specific guidelines to those with heart or lung disease. Demonstration and skill practice provided.   Cardiac Rehab from 03/24/2017 in [facility] Cardiac and Pulmonary Rehab  Date  02/01/17  Educator  [facility]  Instruction Review Code  2- meets goals/outcomes      Stress Management: - Provides group verbal and written instruction about the health risks of elevated stress, cause of high stress, and healthy ways to reduce stress.   Cardiac Rehab from 03/24/2017 in [facility] Cardiac and Pulmonary Rehab  Date  02/10/17  Educator  [facility]  Instruction Review Code  2- meets goals/outcomes      Depression: - Provides group verbal and written instruction on the correlation between heart/lung disease and depressed mood, treatment options, and the stigmas associated with seeking treatment.   Cardiac Rehab from 03/24/2017 in [facility] Cardiac and Pulmonary Rehab  Date  03/10/17  Educator  [facility]  Instruction Review Code  2- meets goals/outcomes      Anatomy & Physiology of the Heart: - Group verbal and written instruction and models provide basic cardiac anatomy and physiology, with the coronary electrical and arterial systems. Review of: AMI, Angina, Valve disease, Heart  Failure, Cardiac Arrhythmia, Pacemakers, and the ICD.   Cardiac Rehab from 03/24/2017 in [place] Cardiac and Pulmonary Rehab  Date  02/08/17  Educator  CE  Instruction Review Code  2- meets goals/outcomes      Cardiac Procedures: - Group verbal and written instruction and models to describe the testing methods done to diagnose heart disease. Reviews the outcomes of the test results. Describes the treatment choices: Medical Management, Angioplasty, or Coronary Bypass Surgery.   Cardiac Rehab from 03/24/2017 in [facility] Cardiac and Pulmonary Rehab  Date  02/15/17  Educator  CE  Instruction Review Code  2- meets goals/outcomes      Cardiac Medications: - Group verbal and written instruction to review commonly prescribed medications for heart disease. Reviews the medication, class of the drug,  and side effects. Includes the steps to properly store meds and maintain the prescription regimen.   Cardiac Rehab from 03/24/2017 in [facility] Cardiac and Pulmonary Rehab  Date  02/24/17  Educator  SB  Instruction Review Code  2- meets goals/outcomes [part one]      Go Sex-Intimacy & Heart Disease, Get SMART - Goal Setting: - Group verbal and written instruction through game format to discuss heart disease and the return to sexual intimacy. Provides group verbal and written material to discuss and apply goal setting through the application of the S.M.A.R.T. Method.   Cardiac Rehab from 03/24/2017 in [facility] Cardiac and Pulmonary Rehab  Date  02/15/17  Educator  CE  Instruction Review Code  2- meets goals/outcomes      Other Matters of the Heart: - Provides group verbal, written materials and models to describe Heart Failure, Angina, Valve Disease, and Diabetes in the realm of heart disease. Includes description of the disease process and treatment options available to the cardiac patient.   Cardiac Rehab from 03/24/2017 in [facility] Cardiac and Pulmonary Rehab  Date  02/08/17  Educator  CE  Instruction Review Code   2- meets goals/outcomes      Exercise & Equipment Safety: - Individual verbal instruction and demonstration of equipment use and safety with use of the equipment.   Cardiac Rehab from 03/24/2017 in [facility] Cardiac and Pulmonary Rehab  Date  01/25/17  Educator  C. EnterkinRN  Instruction Review Code  1- partially meets, needs review/practice      Infection Prevention: - Provides verbal and written material to individual with discussion of infection control including proper hand washing and proper equipment cleaning during exercise session.   Cardiac Rehab from 03/24/2017 in [facility] Cardiac and Pulmonary Rehab  Date  01/25/17  Educator  C. EnterkinRN  Instruction Review Code  2- meets goals/outcomes      Falls Prevention: - Provides verbal and written material to individual with discussion of falls prevention and safety.   Cardiac Rehab from 03/24/2017 in [facility] Cardiac and Pulmonary Rehab  Date  01/25/17  Educator  C. Tyrihanna Wingert Therapist, sports  Instruction Review Code  2- meets goals/outcomes      Diabetes: - Individual verbal and written instruction to review signs/symptoms of diabetes, desired ranges of glucose level fasting, after meals and with exercise. Advice that pre and post exercise glucose checks will be done for 3 sessions at entry of program.    Knowledge Questionnaire Score:     Knowledge Questionnaire Score - 03/19/17 0942      Knowledge Questionnaire Score   Pre Score 24/28   Post Score 27/28      Core Components/Risk Factors/Patient Goals at Admission:     Personal Goals and Risk Factors at Admission - 01/25/17 1318      Core Components/Risk Factors/Patient Goals on Admission    Weight Management Weight Loss;Yes   Intervention Weight Management: Develop a combined nutrition and exercise program designed to reach desired caloric intake, while maintaining appropriate intake of nutrient and fiber, sodium and fats, and appropriate energy expenditure required for the weight  goal.;Weight Management: Provide education and appropriate resources to help participant work on and attain dietary goals.   Admit Weight 197 lb (89.4 kg)   Goal Weight: Short Term 195 lb (88.5 kg)   Goal Weight: Long Term 190 lb (86.2 kg)   Expected Outcomes Short Term: Continue to assess and modify interventions until short term weight is achieved;Weight Loss: Understanding of general recommendations for a  balanced deficit meal plan, which promotes 1-2 lb weight loss per week and includes a negative energy balance of [phone] kcal/d;Understanding recommendations for meals to include 15-35% energy as protein, 25-35% energy from fat, 35-60% energy from carbohydrates, less than 231m of dietary cholesterol, 20-35 gm of total fiber daily;Understanding of distribution of calorie intake throughout the day with the consumption of 4-5 meals/snacks;Long Term: Adherence to nutrition and physical activity/exercise program aimed toward attainment of established weight goal   Hypertension Yes   Intervention Provide education on lifestyle modifcations including regular physical activity/exercise, weight management, moderate sodium restriction and increased consumption of fresh fruit, vegetables, and low fat dairy, alcohol moderation, and smoking cessation.;Monitor prescription use compliance.   Expected Outcomes Short Term: Continued assessment and intervention until BP is < 140/956mHG in hypertensive participants. < 130/8090mG in hypertensive participants with diabetes, heart failure or chronic kidney disease.;Long Term: Maintenance of blood pressure at goal levels.   Lipids Yes   Intervention Provide education and support for participant on nutrition & aerobic/resistive exercise along with prescribed medications to achieve LDL <10m50mDL >40mg74mExpected Outcomes Short Term: Participant states understanding of desired cholesterol values and is compliant with medications prescribed. Participant is following  exercise prescription and nutrition guidelines.;Long Term: Cholesterol controlled with medications as prescribed, with individualized exercise RX and with personalized nutrition plan. Value goals: LDL < 10mg,10m > 40 mg.      Core Components/Risk Factors/Patient Goals Review:      Goals and Risk Factor Review    Row Name 02/12/17 0756 03/26/17 0747 03/26/17 0755 03/26/17 0756       Core Components/Risk Factors/Patient Goals Review   Personal Goals Review Weight Management/Obesity;Hypertension;Lipids  -  -  -    Review Jason Craig is new to the program, working on nutrition goals, compliant with his meds and is aware of sodium intake. WEight 193lbs today. Blood pressure today was 132/64 "Jason Craig" met his short term goal of weight loss to 193lbs. His cholestrol levels are good and he said Dr. CallwoClayborn Bignesstly told him to stay on his cholestrol lowering medicines. Jason Craig Jason Peacehe has 94 yea56old brother who is doing well. Quintan Emrysy"Jason Peaceoping to take a CPR AED class that will be offered at his church by the [employer] [employer]     Expected Outcomes Continued control of lifestyle to reach and maintian risk factor goals  -  - Continue heart healthy living since he will complete 36/36 Cardiac Rehab sessions today.        Core Components/Risk Factors/Patient Goals at Discharge (Final Review):      Goals and Risk Factor Review - 03/26/17 0756      Core Components/Risk Factors/Patient Goals Review   Review Jason Craig Jason Peacehe has 94 yea64old brother who is doing well. Jason Shelvyy"Jason Peaceoping to take a CPR AED class that will be offered at his church by the [employer] [employer].    Expected Outcomes Continue heart healthy living since he will complete 36/36 Cardiac Rehab sessions today.       ITP Comments:     ITP Comments    Row Name 01/25/17 1316 02/03/17 0549 03/03/17 0546 03/26/17 0753     ITP Comments ITP created during Medical Review. [facility]2016 30 day review. Continue with ITP unless directed changes per Medical Director review 30 day review. Continue with ITP unless directed changes per Medical  Director review Jason Craig" said he doesn't know why he had a heart attack since his cholestrol is good but he said Dr. Clayborn Bigness told him to stay on his cholestrol lowering medicines. Jason Craig reports he has been walking alot for exercise over the years.       Comments:

## 2017-03-26 NOTE — Progress Notes (Signed)
Discharge Summary  Patient Details  Name: Jason Craig MRN: [id number] Date of Birth: 01-15-1941 Referring Provider:     Cardiac Rehab from 01/25/2017 in [facility] Cardiac and Pulmonary Rehab  Referring Provider  Lujean Amel MD       Number of Visits: 36/36  Reason for Discharge:  Patient reached a stable level of exercise. Patient independent in their exercise.  Smoking History:  History  Smoking Status  . Former Smoker  . Types: Cigarettes  . Quit date: 11/25/1983  Smokeless Tobacco  . Never Used    Diagnosis:  No diagnosis found.  ADL UCSD:   Initial Exercise Prescription:     Initial Exercise Prescription - 01/25/17 1400      Date of Initial Exercise RX and Referring Provider   Date 01/25/17   Referring Provider Lujean Amel MD     Treadmill   MPH 3.5   Grade 0.5   Minutes 15   METs 3.68     NuStep   Level 3   SPM 100   Minutes 15   METs 3     REL-XR   Level 3   Speed 50   Minutes 15   METs 3     Prescription Details   Frequency (times per week) 3   Duration Progress to 45 minutes of aerobic exercise without signs/symptoms of physical distress     Intensity   THRR 40-80% of Max Heartrate 90-125   Ratings of Perceived Exertion 11-13   Perceived Dyspnea 0-4     Progression   Progression Continue to progress workloads to maintain intensity without signs/symptoms of physical distress.     Resistance Training   Training Prescription Yes   Weight 3 lbs      Discharge Exercise Prescription (Final Exercise Prescription Changes):     Exercise Prescription Changes - 03/22/17 1600      Response to Exercise   Blood Pressure (Admit) 138/82   Blood Pressure (Exercise) 134/60   Blood Pressure (Exit) 124/60   Heart Rate (Admit) 56 bpm   Heart Rate (Exercise) 113 bpm   Heart Rate (Exit) 82 bpm   Rating of Perceived Exertion (Exercise) 12   Symptoms none   Duration Continue with 45 min of aerobic exercise without signs/symptoms of  physical distress.   Intensity THRR unchanged     Progression   Progression Continue to progress workloads to maintain intensity without signs/symptoms of physical distress.   Average METs 6.81     Resistance Training   Training Prescription Yes   Weight 5 lbs   Reps 10-15     Interval Training   Interval Training Yes   Equipment Recumbant Elliptical;NuStep   Comments 30 sec on     Treadmill   MPH 4   Grade 4   Minutes 15   METs 6.27     NuStep   Level 7   Minutes 15   METs 4.3     REL-XR   Level 5   Minutes 15   METs 10     Home Exercise Plan   Plans to continue exercise at Home (comment)  walking   Frequency Add 3 additional days to program exercise sessions.   Initial Home Exercises Provided 02/08/17      Functional Capacity:     6 Minute Walk    Row Name 01/25/17 1426 03/19/17 0939       6 Minute Walk   Phase Initial Discharge    Distance 1870 feet 2170  feet    Distance % Change  - 16 %  300 ft    Walk Time 6 minutes 6 minutes    # of Rest Breaks 0 0    MPH 3.54 4.11    METS 3.95 4.53    RPE 9 12    VO2 Peak 13.82 15.85    Symptoms No No    Resting HR 54 bpm 75 bpm    Resting BP 116/64 138/70    Max Ex. HR 127 bpm 120 bpm    Max Ex. BP 126/64 144/60    2 Minute Post BP 122/60  -       Psychological, QOL, Others - Outcomes: PHQ 2/9: Depression screen [facility] 2/9 03/19/2017 02/25/2017 01/25/2017 08/27/2016 08/22/2015  Decreased Interest 0 0 0 0 0  Down, Depressed, Hopeless 0 0 0 0 0  PHQ - 2 Score 0 0 0 0 0  Altered sleeping 0 - 0 - -  Tired, decreased energy 0 - 0 - -  Change in appetite 0 - 0 - -  Feeling bad or failure about yourself  0 - 0 - -  Trouble concentrating 0 - 0 - -  Moving slowly or fidgety/restless 0 - 0 - -  Suicidal thoughts 0 - 0 - -  PHQ-9 Score 0 - 0 - -    Quality of Life:     Quality of Life - 03/19/17 0941      Quality of Life Scores   Health/Function Pre 30 %   Health/Function Post 29.2 %   Health/Function  % Change -2.67 %   Socioeconomic Pre 30 %   Socioeconomic Post 30 %   Socioeconomic % Change  0 %   Psych/Spiritual Pre 30 %   Psych/Spiritual Post 30 %   Psych/Spiritual % Change 0 %   Family Pre 30 %   Family Post 30 %   Family % Change 0 %   GLOBAL Pre 30 %   GLOBAL Post 29.65 %   GLOBAL % Change -1.17 %      Personal Goals: Goals established at orientation with interventions provided to work toward goal.     Personal Goals and Risk Factors at Admission - 01/25/17 1318      Core Components/Risk Factors/Patient Goals on Admission    Weight Management Weight Loss;Yes   Intervention Weight Management: Develop a combined nutrition and exercise program designed to reach desired caloric intake, while maintaining appropriate intake of nutrient and fiber, sodium and fats, and appropriate energy expenditure required for the weight goal.;Weight Management: Provide education and appropriate resources to help participant work on and attain dietary goals.   Admit Weight 197 lb (89.4 kg)   Goal Weight: Short Term 195 lb (88.5 kg)   Goal Weight: Long Term 190 lb (86.2 kg)   Expected Outcomes Short Term: Continue to assess and modify interventions until short term weight is achieved;Weight Loss: Understanding of general recommendations for a balanced deficit meal plan, which promotes 1-2 lb weight loss per week and includes a negative energy balance of [phone] kcal/d;Understanding recommendations for meals to include 15-35% energy as protein, 25-35% energy from fat, 35-60% energy from carbohydrates, less than 26m of dietary cholesterol, 20-35 gm of total fiber daily;Understanding of distribution of calorie intake throughout the day with the consumption of 4-5 meals/snacks;Long Term: Adherence to nutrition and physical activity/exercise program aimed toward attainment of established weight goal   Hypertension Yes   Intervention Provide education on lifestyle modifcations including regular  physical  activity/exercise, weight management, moderate sodium restriction and increased consumption of fresh fruit, vegetables, and low fat dairy, alcohol moderation, and smoking cessation.;Monitor prescription use compliance.   Expected Outcomes Short Term: Continued assessment and intervention until BP is < 140/3m HG in hypertensive participants. < 130/831mHG in hypertensive participants with diabetes, heart failure or chronic kidney disease.;Long Term: Maintenance of blood pressure at goal levels.   Lipids Yes   Intervention Provide education and support for participant on nutrition & aerobic/resistive exercise along with prescribed medications to achieve LDL <7077mHDL >31m47m Expected Outcomes Short Term: Participant states understanding of desired cholesterol values and is compliant with medications prescribed. Participant is following exercise prescription and nutrition guidelines.;Long Term: Cholesterol controlled with medications as prescribed, with individualized exercise RX and with personalized nutrition plan. Value goals: LDL < 70mg4mL > 40 mg.       Personal Goals Discharge:     Goals and Risk Factor Review    Row Name 02/12/17 0756 03/26/17 0747 03/26/17 0755 03/26/17 0756       Core Components/Risk Factors/Patient Goals Review   Personal Goals Review Weight Management/Obesity;Hypertension;Lipids  -  -  -    Review JIm is new to the program, working on nutrition goals, compliant with his meds and is aware of sodium intake. WEight 193lbs today. Blood pressure today was 132/64 "Jimmy" met his short term goal of weight loss to 193lbs. His cholestrol levels are good and he said Dr. CallwClayborn Bignessntly told him to stay on his cholestrol lowering medicines. JimmyLaverna Peace he has 94 ye96 old brother who is doing well. JamesBrandynmyLaverna Peacehoping to take a CPR AED class that will be offered at his church by the [facility] [employer].     Expected Outcomes Continued control of  lifestyle to reach and maintian risk factor goals  -  - Continue heart healthy living since he will complete 36/36 Cardiac Rehab sessions today.        Nutrition & Weight - Outcomes:     Pre Biometrics - 03/19/17 0947      Pre Biometrics   Height 6' 0.3" (1.836 m)   Weight 193 lb (87.5 kg)   Waist Circumference 40 inches   Hip Circumference 38.5 inches   Waist to Hip Ratio 1.04 %   BMI (Calculated) 26   Single Leg Stand 26.51 seconds       Nutrition:     Nutrition Therapy & Goals - 02/12/17 0751      Personal Nutrition Goals   Nutrition Goal Continue with current eating pattern   Personal Goal #2 ideally include protein food with breakfast and lunch as well as supper.      Nutrition Discharge:     Nutrition Assessments - 03/19/17 0941      MEDFICTS Scores   Pre Score 49   Post Score 39   Score Difference -10      Education Questionnaire Score:     Knowledge Questionnaire Score - 03/19/17 0942      Knowledge Questionnaire Score   Pre Score 24/28   Post Score 27/28      Goals reviewed with patient; copy given to patient.

## 2017-03-26 NOTE — Progress Notes (Signed)
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  aspirin EC 81 MG EC tablet, Take 1 tablet (81 mg total) by mouth daily., Disp: 30 tablet, Rfl: 0 .  atorvastatin (LIPITOR) 40 MG tablet, Take 1 tablet (40 mg total) by mouth daily at 6 PM., Disp: 90 tablet, Rfl: 1 .  Calcium Citrate-Vitamin D (CALCIUM + D PO), Take 1 tablet by mouth daily., Disp: , Rfl:  .  clopidogrel (PLAVIX) 75 MG tablet, Take 1 tablet (75 mg total) by mouth daily with breakfast., Disp: 30 tablet, Rfl: 0 .  Coenzyme Q10 (COQ10 PO), Take 1 capsule by mouth daily., Disp: , Rfl:  .  latanoprost (XALATAN) 0.005 % ophthalmic solution, Place 1 drop into both eyes at bedtime., Disp: , Rfl: 0 .  losartan (COZAAR) 25 MG tablet, Take 25 mg by mouth daily., Disp: , Rfl:  .  metoprolol tartrate (LOPRESSOR) 50 MG tablet, Take 0.5 tablets (25 mg total) by mouth 2 (two) times daily., Disp: 60 tablet, Rfl: 0 .  Multiple Vitamin (MULTIVITAMIN WITH MINERALS) TABS tablet, Take 1 tablet by mouth daily., Disp: , Rfl:  .  multivitamin-lutein (OCUVITE-LUTEIN) CAPS capsule, Take 1 capsule by mouth daily., Disp: , Rfl:  .  Omega-3 Fatty Acids (FISH OIL) 1000 MG CAPS, Take 1 capsule (1,000 mg total) by mouth daily., Disp: 30 capsule, Rfl: 0 .  Omega-3 Fatty Acids (SALMON OIL-1000 PO), Take 2,000 mg by mouth., Disp: , Rfl:  .  Probiotic Product (PROBIOTIC PO), Take 1 capsule by mouth daily., Disp: , Rfl:   Past  Medical History: Past Medical History:  Diagnosis Date  . Hypertension   . 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 Rib fracture 07/11/2015   Horse Accident     Tobacco Use: History  Smoking Status  . Former Smoker  . Types: Cigarettes  . Quit date: 11/25/1983  Smokeless Tobacco  . Never Used    Labs: Recent Review Flowsheet Data    Labs for ITP Cardiac and Pulmonary Rehab Latest Ref Rng & Units 12/27/2016 02/25/2017   Cholestrol <200 mg/dL 91 75   LDLCALC <100 mg/dL 39 22   HDL >40 mg/dL 29(L) 38(L)   Trlycerides <150 mg/dL 117 76   Hemoglobin A1c 4.8 - 5.6 % 5.7(H) -       Exercise Target Goals:    Exercise Program Goal: Individual exercise prescription set with THRR, safety & activity barriers. Participant demonstrates ability to understand and report RPE using BORG scale, to self-measure pulse accurately, and to acknowledge the importance of the exercise prescription.  Exercise Prescription Goal: Starting with aerobic activity 30 plus minutes a day, 3 days per week for initial exercise prescription. Provide home exercise prescription and guidelines that participant acknowledges understanding prior to discharge.  Activity Barriers & Risk Stratification:     Activity Barriers & Cardiac Risk Stratification - 01/25/17 1318      Activity  Barriers & Cardiac Risk Stratification   Activity Barriers None   Cardiac Risk Stratification High      6 Minute Walk:     6 Minute Walk    Row Name 01/25/17 1426 03/19/17 0939       6 Minute Walk   Phase Initial Discharge    Distance 1870 feet 2170 feet    Distance % Change  - 16 %  300 ft    Walk Time 6 minutes 6 minutes    # of Rest Breaks 0 0    MPH 3.54 4.11    METS 3.95 4.53    RPE 9 12    VO2 Peak 13.82 15.85    Symptoms No No    Resting HR 54 bpm 75 bpm    Resting BP 116/64 138/70    Max Ex. HR 127 bpm 120 bpm    Max Ex. BP 126/64 144/60    2 Minute Post BP 122/60   -       Oxygen Initial Assessment:   Oxygen Re-Evaluation:   Oxygen Discharge (Final Oxygen Re-Evaluation):   Initial Exercise Prescription:     Initial Exercise Prescription - 01/25/17 1400      Date of Initial Exercise RX and Referring Provider   Date 01/25/17   Referring Provider Lujean Amel MD     Treadmill   MPH 3.5   Grade 0.5   Minutes 15   METs 3.68     NuStep   Level 3   SPM 100   Minutes 15   METs 3     REL-XR   Level 3   Speed 50   Minutes 15   METs 3     Prescription Details   Frequency (times per week) 3   Duration Progress to 45 minutes of aerobic exercise without signs/symptoms of physical distress     Intensity   THRR 40-80% of Max Heartrate 90-125   Ratings of Perceived Exertion 11-13   Perceived Dyspnea 0-4     Progression   Progression Continue to progress workloads to maintain intensity without signs/symptoms of physical distress.     Resistance Training   Training Prescription Yes   Weight 3 lbs      Perform Capillary Blood Glucose checks as needed.  Exercise Prescription Changes:     Exercise Prescription Changes    Row Name 01/25/17 1400 02/08/17 1000 02/10/17 1600 02/25/17 1200 03/11/17 1400     Response to Exercise   Blood Pressure (Admit) 116/64  - 106/54 138/62 124/70   Blood Pressure (Exercise) 126/64  - 144/60 136/70 140/64   Blood Pressure (Exit) 122/60  - 124/68 112/60 130/64   Heart Rate (Admit) 54 bpm  - 74 bpm 49 bpm 66 bpm   Heart Rate (Exercise) 127 bpm  - 105 bpm 98 bpm 134 bpm   Heart Rate (Exit) 58 bpm  - 54 bpm 64 bpm 58 bpm   Oxygen Saturation (Admit) 99 %  -  -  -  -   Oxygen Saturation (Exercise) 98 %  -  -  -  -   Rating of Perceived Exertion (Exercise) 9  - _0 Symptoms _1    Comments walk test results  -  -  -  -   Duration  -  - Continue with 45 min of aerobic exercise without signs/symptoms of physical distress. Continue with 45 min of aerobic exercise  without signs/symptoms of physical distress.  Continue with 45 min of aerobic exercise without signs/symptoms of physical distress.   Intensity  -  - THRR unchanged THRR unchanged THRR unchanged     Progression   Progression  -  - Continue to progress workloads to maintain intensity without signs/symptoms of physical distress. Continue to progress workloads to maintain intensity without signs/symptoms of physical distress. Continue to progress workloads to maintain intensity without signs/symptoms of physical distress.   Average METs  -  - 3.97 4.87 5.27     Resistance Training   Training Prescription  - Yes Yes Yes Yes   Weight  - 3 lbs 3 lbs 4 lbs 5 lbs   Reps  -  - 10-15 10-15 10-15     Interval Training   Interval Training  -  - No No No     Treadmill   MPH  - 3.5 3.5 3.8 4   Grade  - 0.5 0._0 Minutes  - _1 METs  - 3.68 3.92 4.96 7.1     NuStep   Level  - _2 SPM  - 100 100 100  -   Minutes  - _3 METs  - 3 4 5.1 4     REL-XR   Level  - 3 4 4.5 5   Speed  - 50  -  -  -   Minutes  - _4 METs  - 3 4 3.4 4.3     Home Exercise Plan   Plans to continue exercise at  - Home (comment)  walking Home (comment)  walking Home (comment)  walking Home (comment)  walking   Frequency  - Add 3 additional days to program exercise sessions. Add 3 additional days to program exercise sessions. Add 3 additional days to program exercise sessions. Add 3 additional days to program exercise sessions.   Initial Home Exercises Provided  - 02/08/17 02/08/17 02/08/17 02/08/17   Row Name 03/22/17 1600             Response to Exercise   Blood Pressure (Admit) 138/82       Blood Pressure (Exercise) 134/60       Blood Pressure (Exit) 124/60       Heart Rate (Admit) 56 bpm       Heart Rate (Exercise) 113 bpm       Heart Rate (Exit) 82 bpm       Rating of Perceived Exertion (Exercise) 12       Symptoms none       Duration Continue with 45 min of  aerobic exercise without signs/symptoms of physical distress.       Intensity THRR unchanged         Progression   Progression Continue to progress workloads to maintain intensity without signs/symptoms of physical distress.       Average METs 6.81         Resistance Training   Training Prescription Yes       Weight 5 lbs       Reps 10-15         Interval Training   Interval Training Yes       Equipment Recumbant Elliptical;NuStep       Comments 30 sec on         Treadmill   MPH 4       Grade 4  Minutes 15       METs 6.27         NuStep   Level 7       Minutes 15       METs 4.3         REL-XR   Level 5       Minutes 15       METs 10         Home Exercise Plan   Plans to continue exercise at Home (comment)  walking       Frequency Add 3 additional days to program exercise sessions.       Initial Home Exercises Provided 02/08/17          Exercise Comments:     Exercise Comments    Row Name 01/29/17 0756 02/03/17 0915 02/12/17 0846 03/01/17 0840 03/15/17 6789   Exercise Comments First full day of exercise!  Patient was oriented to gym and equipment including functions, settings, policies, and procedures.  Patient's individual exercise prescription and treatment plan were reviewed.  All starting workloads were established based on the results of the 6 minute walk test done at initial orientation visit.  The plan for exercise progression was also introduced and progression will be customized based on patient's performance and goals. Switched from XR to REL permanently due to availability. Reviewed METs average and discussed progression with pt today. Reviewed METs average and discussed progression with pt today. Reviewed METs average and discussed progression with pt today.      Exercise Goals and Review:     Exercise Goals    Row Name 01/25/17 1430             Exercise Goals   Increase Physical Activity Yes       Intervention Provide advice, education,  support and counseling about physical activity/exercise needs.;Develop an individualized exercise prescription for aerobic and resistive training based on initial evaluation findings, risk stratification, comorbidities and participant's personal goals.       Expected Outcomes Achievement of increased cardiorespiratory fitness and enhanced flexibility, muscular endurance and strength shown through measurements of functional capacity and personal statement of participant.       Increase Strength and Stamina Yes       Intervention Provide advice, education, support and counseling about physical activity/exercise needs.;Develop an individualized exercise prescription for aerobic and resistive training based on initial evaluation findings, risk stratification, comorbidities and participant's personal goals.       Expected Outcomes Achievement of increased cardiorespiratory fitness and enhanced flexibility, muscular endurance and strength shown through measurements of functional capacity and personal statement of participant.          Exercise Goals Re-Evaluation :     Exercise Goals Re-Evaluation    Row Name 02/08/17 1020 02/10/17 1613 02/25/17 1208 03/11/17 1445 03/19/17 0940     Exercise Goal Re-Evaluation   Exercise Goals Review Increase Physical Activity;Increase Strenth and Stamina Increase Physical Activity;Increase Strenth and Stamina Increase Physical Activity;Increase Strenth and Stamina Increase Physical Activity;Increase Strenth and Stamina Increase Physical Activity;Increase Strenth and Stamina   Comments Reviewed home exercise with pt today.  Pt plans to continue walking at home for exercise.  Reviewed THR, pulse, RPE, sign and symptoms, and when to call 911 or MD.  Also discussed weather considerations and indoor options.  Pt voiced understanding. Laverna Peace is off to a good start with rehab.  He is enjoying the REL.  He is now up to level four on both the REL  and NuStep.  We will continue to  monitor his progression. Laverna Peace has been doing well in rehab.  He is now up to level 7 on the NuStep.  We will continue to monitor his progression. Laverna Peace continues to do well in rehab.  He is now doing 4 mph with a 4% incline on the treadmill!  He is also up to level 5 on the recumbent elliptical!  We will continue to monitor his progression. Jimmy improved his walk test by 16%!!   Expected Outcomes Short and Long: Continue to walk at home for exercise to increase strength and stamina Short: Laverna Peace will continue to increase workloads and add more incline on treadmill.  Long: Continue to come to class to work on strength and stamina. Short: We will discuss adding in interval training.  Long: Continue to work on IT sales professional. Short: Continue to encourage Jimmy to try the intervals versus just moving up workloads.  Long: Continue to make exercise part of his routine.  -   Row Name 03/22/17 1607             Exercise Goal Re-Evaluation   Exercise Goals Review Increase Physical Activity;Increase Strenth and Stamina       Comments Laverna Peace will be graduating on Friday!  He has started some intervals on the REL and the NuStep, just not with a consistent time in between.  He has already started going to the gym on his off days and plans to continue daily!       Expected Outcomes Short: Laverna Peace will graduate!  Long: Continue to exercise daily at the gym!          Discharge Exercise Prescription (Final Exercise Prescription Changes):     Exercise Prescription Changes - 03/22/17 1600      Response to Exercise   Blood Pressure (Admit) 138/82   Blood Pressure (Exercise) 134/60   Blood Pressure (Exit) 124/60   Heart Rate (Admit) 56 bpm   Heart Rate (Exercise) 113 bpm   Heart Rate (Exit) 82 bpm   Rating of Perceived Exertion (Exercise) 12   Symptoms none   Duration Continue with 45 min of aerobic exercise without signs/symptoms of physical distress.   Intensity THRR unchanged     Progression    Progression Continue to progress workloads to maintain intensity without signs/symptoms of physical distress.   Average METs 6.81     Resistance Training   Training Prescription Yes   Weight 5 lbs   Reps 10-15     Interval Training   Interval Training Yes   Equipment Recumbant Elliptical;NuStep   Comments 30 sec on     Treadmill   MPH 4   Grade 4   Minutes 15   METs 6.27     NuStep   Level 7   Minutes 15   METs 4.3     REL-XR   Level 5   Minutes 15   METs 10     Home Exercise Plan   Plans to continue exercise at Home (comment)  walking   Frequency Add 3 additional days to program exercise sessions.   Initial Home Exercises Provided 02/08/17      Nutrition:  Target Goals: Understanding of nutrition guidelines, daily intake of sodium <1527m, cholesterol <208m calories 30% from fat and 7% or less from saturated fats, daily to have 5 or more servings of fruits and vegetables.  Biometrics:     Pre Biometrics - 03/19/17 092330    Pre  Biometrics   Height 6' 0.3" (1.836 m)   Weight 193 lb (87.5 kg)   Waist Circumference 40 inches   Hip Circumference 38.5 inches   Waist to Hip Ratio 1.04 %   BMI (Calculated) 26   Single Leg Stand 26.51 seconds       Nutrition Therapy Plan and Nutrition Goals:     Nutrition Therapy & Goals - 02/12/17 0751      Personal Nutrition Goals   Nutrition Goal Continue with current eating pattern   Personal Goal #2 ideally include protein food with breakfast and lunch as well as supper.      Nutrition Discharge: Rate Your Plate Scores:     Nutrition Assessments - 03/19/17 0941      MEDFICTS Scores   Pre Score 49   Post Score 39   Score Difference -10      Nutrition Goals Re-Evaluation:     Nutrition Goals Re-Evaluation    Row Name 02/12/17 0753 03/26/17 0803           Goals   Current Weight  - 193 lb (87.5 kg)      Nutrition Goal Continue with current eating pattern  -      Comment Continues to eat as has  been, adding some more meat to meals.  Stated may try protein drink during day since does not eat protein item at breakfast Has added a protein to his breakfast most morningt.      Expected Outcome Continue with nutrition plan as is with more protein by more meat selection or use of the protein drink  -        Personal Goal #2 Re-Evaluation   Personal Goal #2 ideally include protein food with breakfast and lunch as well as supper. Has increased his protein intake.         Nutrition Goals Discharge (Final Nutrition Goals Re-Evaluation):     Nutrition Goals Re-Evaluation - 03/26/17 0803      Goals   Current Weight 193 lb (87.5 kg)   Comment Has added a protein to his breakfast most morningt.     Personal Goal #2 Re-Evaluation   Personal Goal #2 Has increased his protein intake.      Psychosocial: Target Goals: Acknowledge presence or absence of significant depression and/or stress, maximize coping skills, provide positive support system. Participant is able to verbalize types and ability to use techniques and skills needed for reducing stress and depression.   Initial Review & Psychosocial Screening:     Initial Psych Review & Screening - 01/25/17 1211      Initial Review   Current issues with None Identified     Family Dynamics   Good Support System? Yes     Barriers   Psychosocial barriers to participate in program There are no identifiable barriers or psychosocial needs.     Screening Interventions   Interventions Encouraged to exercise      Quality of Life Scores:      Quality of Life - 03/19/17 0941      Quality of Life Scores   Health/Function Pre 30 %   Health/Function Post 29.2 %   Health/Function % Change -2.67 %   Socioeconomic Pre 30 %   Socioeconomic Post 30 %   Socioeconomic % Change  0 %   Psych/Spiritual Pre 30 %   Psych/Spiritual Post 30 %   Psych/Spiritual % Change 0 %   Family Pre 30 %   Family Post 30 %  Family % Change 0 %   GLOBAL Pre  30 %   GLOBAL Post 29.65 %   GLOBAL % Change -1.17 %      PHQ-9: Recent Review Flowsheet Data    Depression screen [facility] 2/9 03/19/2017 02/25/2017 01/25/2017 08/27/2016 08/22/2015   Decreased Interest 0 0 0 0 0   Down, Depressed, Hopeless 0 0 0 0 0   PHQ - 2 Score 0 0 0 0 0   Altered sleeping 0 - 0 - -   Tired, decreased energy 0 - 0 - -   Change in appetite 0 - 0 - -   Feeling bad or failure about yourself  0 - 0 - -   Trouble concentrating 0 - 0 - -   Moving slowly or fidgety/restless 0 - 0 - -   Suicidal thoughts 0 - 0 - -   PHQ-9 Score 0 - 0 - -     Interpretation of Total Score  Total Score Depression Severity:  1-4 = Minimal depression, 5-9 = Mild depression, 10-14 = Moderate depression, 15-19 = Moderately severe depression, 20-27 = Severe depression   Psychosocial Evaluation and Intervention:     Psychosocial Evaluation - 03/26/17 0748      Psychosocial Evaluation & Interventions   Interventions Therapist referral      Psychosocial Re-Evaluation:     Psychosocial Re-Evaluation    Row Name 02/12/17 0755 03/26/17 0759           Psychosocial Re-Evaluation   Current issues with None Identified  -      Comments  - Jeneen Rinks "Laverna Peace' said he has "really enjoyed this (Cardiac Rehab) and 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 Ashaun said he does not feel he has any stress since he has realized somethings like having a heart attack is out of his control but he knows to keep exercising.      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 Churchill said he does not feel he has any stress since he has realized somethings like having a heart attack is out of his control but he knows to keep exercising.      Vocational Rehabilitation: Provide vocational rehab assistance to qualifying candidates.   Vocational Rehab Evaluation & Intervention:     Vocational Rehab - 01/25/17 1316      Initial Vocational Rehab Evaluation & Intervention   Assessment shows need for Vocational Rehabilitation No      Education: Education Goals: Education classes will be provided on a weekly basis, covering required topics. Participant will state understanding/return demonstration of topics presented.  Learning Barriers/Preferences:     Learning Barriers/Preferences - 01/25/17 1316      Learning Barriers/Preferences   Learning Barriers None   Learning Preferences Individual Instruction      Education Topics: General Nutrition Guidelines/Fats and Fiber: -Group instruction provided by verbal, written material, models and posters to present the general guidelines for heart healthy nutrition. Gives an explanation and review of dietary fats  and fiber.   Cardiac Rehab from 03/24/2017 in [facility] Cardiac and Pulmonary Rehab  Date  03/08/17  Educator  CR  Instruction Review Code  2- meets goals/outcomes      Controlling Sodium/Reading Food Labels: -Group verbal and written material supporting the discussion of sodium use in heart healthy nutrition. Review and explanation with models, verbal and written materials for utilization of the food label.   Cardiac Rehab from 03/24/2017 in [facility] Cardiac and Pulmonary Rehab  Date  03/15/17  Educator  CR  Instruction Review Code  2- meets goals/outcomes      Exercise Physiology & Risk Factors: - Group verbal and written instruction with models to review the exercise physiology of the cardiovascular system and associated critical values. Details cardiovascular disease risk factors and the  goals associated with each risk factor.   Cardiac Rehab from 03/24/2017 in [facility] Cardiac and Pulmonary Rehab  Date  03/22/17  Educator  [facility]  Instruction Review Code  2- meets goals/outcomes      Aerobic Exercise & Resistance Training: - Gives group verbal and written discussion on the health impact of inactivity. On the components of aerobic and resistive training programs and the benefits of this training and how to safely progress through these programs.   Cardiac Rehab from 03/24/2017 in [facility] Cardiac and Pulmonary Rehab  Date  03/24/17  Educator  [facility]  Instruction Review Code  2- meets goals/outcomes      Flexibility, Balance, General Exercise Guidelines: - Provides group verbal and written instruction on the benefits of flexibility and balance training programs. Provides general exercise guidelines with specific guidelines to those with heart or lung disease. Demonstration and skill practice provided.   Cardiac Rehab from 03/24/2017 in [facility] Cardiac and Pulmonary Rehab  Date  02/01/17  Educator  [facility]  Instruction Review Code  2- meets goals/outcomes      Stress Management: - Provides group verbal and written instruction about the health risks of elevated stress, cause of high stress, and healthy ways to reduce stress.   Cardiac Rehab from 03/24/2017 in [facility] Cardiac and Pulmonary Rehab  Date  02/10/17  Educator  [facility]  Instruction Review Code  2- meets goals/outcomes      Depression: - Provides group verbal and written instruction on the correlation between heart/lung disease and depressed mood, treatment options, and the stigmas associated with seeking treatment.   Cardiac Rehab from 03/24/2017 in [facility] Cardiac and Pulmonary Rehab  Date  03/10/17  Educator  [facility]  Instruction Review Code  2- meets goals/outcomes      Anatomy & Physiology of the Heart: - Group verbal and written instruction and models provide basic cardiac anatomy and physiology, with the coronary electrical and arterial  systems. Review of: AMI, Angina, Valve disease, Heart Failure, Cardiac Arrhythmia, Pacemakers, and the ICD.   Cardiac Rehab from 03/24/2017 in [facility] Cardiac and Pulmonary Rehab  Date  02/08/17  Educator  CE  Instruction Review Code  2- meets goals/outcomes      Cardiac Procedures: - Group verbal and written instruction and models to describe the testing methods done to diagnose heart disease. Reviews the outcomes of the test results. Describes the treatment choices: Medical Management, Angioplasty, or Coronary Bypass Surgery.   Cardiac Rehab from 03/24/2017 in [facility] Cardiac and Pulmonary Rehab  Date  02/15/17  Educator  CE  Instruction Review Code  2- meets goals/outcomes      Cardiac Medications: - Group verbal and written instruction to review commonly prescribed medications for  heart disease. Reviews the medication, class of the drug, and side effects. Includes the steps to properly store meds and maintain the prescription regimen.   Cardiac Rehab from 03/24/2017 in [facility] Cardiac and Pulmonary Rehab  Date  02/24/17  Educator  SB  Instruction Review Code  2- meets goals/outcomes [part one]      Go Sex-Intimacy & Heart Disease, Get SMART - Goal Setting: - Group verbal and written instruction through game format to discuss heart disease and the return to sexual intimacy. Provides group verbal and written material to discuss and apply goal setting through the application of the S.M.A.R.T. Method.   Cardiac Rehab from 03/24/2017 in [facility] Cardiac and Pulmonary Rehab  Date  02/15/17  Educator  CE  Instruction Review Code  2- meets goals/outcomes      Other Matters of the Heart: - Provides group verbal, written materials and models to describe Heart Failure, Angina, Valve Disease, and Diabetes in the realm of heart disease. Includes description of the disease process and treatment options available to the cardiac patient.   Cardiac Rehab from 03/24/2017 in [facility] Cardiac and Pulmonary Rehab   Date  02/08/17  Educator  CE  Instruction Review Code  2- meets goals/outcomes      Exercise & Equipment Safety: - Individual verbal instruction and demonstration of equipment use and safety with use of the equipment.   Cardiac Rehab from 03/24/2017 in [facility] Cardiac and Pulmonary Rehab  Date  01/25/17  Educator  C. EnterkinRN  Instruction Review Code  1- partially meets, needs review/practice      Infection Prevention: - Provides verbal and written material to individual with discussion of infection control including proper hand washing and proper equipment cleaning during exercise session.   Cardiac Rehab from 03/24/2017 in [facility] Cardiac and Pulmonary Rehab  Date  01/25/17  Educator  C. EnterkinRN  Instruction Review Code  2- meets goals/outcomes      Falls Prevention: - Provides verbal and written material to individual with discussion of falls prevention and safety.   Cardiac Rehab from 03/24/2017 in [facility] Cardiac and Pulmonary Rehab  Date  01/25/17  Educator  C. Enterkin Therapist, sports  Instruction Review Code  2- meets goals/outcomes      Diabetes: - Individual verbal and written instruction to review signs/symptoms of diabetes, desired ranges of glucose level fasting, after meals and with exercise. Advice that pre and post exercise glucose checks will be done for 3 sessions at entry of program.    Knowledge Questionnaire Score:     Knowledge Questionnaire Score - 03/19/17 0942      Knowledge Questionnaire Score   Pre Score 24/28   Post Score 27/28      Core Components/Risk Factors/Patient Goals at Admission:     Personal Goals and Risk Factors at Admission - 01/25/17 1318      Core Components/Risk Factors/Patient Goals on Admission    Weight Management Weight Loss;Yes   Intervention Weight Management: Develop a combined nutrition and exercise program designed to reach desired caloric intake, while maintaining appropriate intake of nutrient and fiber, sodium and fats, and  appropriate energy expenditure required for the weight goal.;Weight Management: Provide education and appropriate resources to help participant work on and attain dietary goals.   Admit Weight 197 lb (89.4 kg)   Goal Weight: Short Term 195 lb (88.5 kg)   Goal Weight: Long Term 190 lb (86.2 kg)   Expected Outcomes Short Term: Continue to assess and modify interventions until short term weight  is achieved;Weight Loss: Understanding of general recommendations for a balanced deficit meal plan, which promotes 1-2 lb weight loss per week and includes a negative energy balance of [phone] kcal/d;Understanding recommendations for meals to include 15-35% energy as protein, 25-35% energy from fat, 35-60% energy from carbohydrates, less than 259m of dietary cholesterol, 20-35 gm of total fiber daily;Understanding of distribution of calorie intake throughout the day with the consumption of 4-5 meals/snacks;Long Term: Adherence to nutrition and physical activity/exercise program aimed toward attainment of established weight goal   Hypertension Yes   Intervention Provide education on lifestyle modifcations including regular physical activity/exercise, weight management, moderate sodium restriction and increased consumption of fresh fruit, vegetables, and low fat dairy, alcohol moderation, and smoking cessation.;Monitor prescription use compliance.   Expected Outcomes Short Term: Continued assessment and intervention until BP is < 140/967mHG in hypertensive participants. < 130/8074mG in hypertensive participants with diabetes, heart failure or chronic kidney disease.;Long Term: Maintenance of blood pressure at goal levels.   Lipids Yes   Intervention Provide education and support for participant on nutrition & aerobic/resistive exercise along with prescribed medications to achieve LDL <75m72mDL >40mg4mExpected Outcomes Short Term: Participant states understanding of desired cholesterol values and is compliant with  medications prescribed. Participant is following exercise prescription and nutrition guidelines.;Long Term: Cholesterol controlled with medications as prescribed, with individualized exercise RX and with personalized nutrition plan. Value goals: LDL < 75mg,104m > 40 mg.      Core Components/Risk Factors/Patient Goals Review:      Goals and Risk Factor Review    Row Name 02/12/17 0756 03/26/17 0747 03/26/17 0755 03/26/17 0756       Core Components/Risk Factors/Patient Goals Review   Personal Goals Review Weight Management/Obesity;Hypertension;Lipids  -  -  -    Review JIm is new to the program, working on nutrition goals, compliant with his meds and is aware of sodium intake. WEight 193lbs today. Blood pressure today was 132/64 "Jimmy" met his short term goal of weight loss to 193lbs. His cholestrol levels are good and he said Dr. CallwoClayborn Bignesstly told him to stay on his cholestrol lowering medicines. Jimmy Laverna Peacehe has 94 yea34old brother who is doing well. Jeffre Riveny"Laverna Peaceoping to take a CPR AED class that will be offered at his church by the [facility] [employer]     Expected Outcomes Continued control of lifestyle to reach and maintian risk factor goals  -  - Continue heart healthy living since he will complete 36/36 Cardiac Rehab sessions today.        Core Components/Risk Factors/Patient Goals at Discharge (Final Review):      Goals and Risk Factor Review - 03/26/17 0756      Core Components/Risk Factors/Patient Goals Review   Review Jimmy Laverna Peacehe has 94 yea16old brother who is doing well. Krystopher Conlany"Laverna Peaceoping to take a CPR AED class that will be offered at his church by the [employer] [employer].    Expected Outcomes Continue heart healthy living since he will complete 36/36 Cardiac Rehab sessions today.       ITP Comments:     ITP Comments    Row Name 01/25/17 1316 02/03/17 0549 03/03/17 0546 03/26/17 0753     ITP Comments ITP  created during Medical Review. [facility]28/2016 30 day review. Continue with ITP unless directed changes per Medical Director review 30 day  review. Continue with ITP unless directed changes per Medical Director review Roe Coombs" said he doesn't know why he had a heart attack since his cholestrol is good but he said Dr. Clayborn Bigness told him to stay on his cholestrol lowering medicines. Andrik reports he has been walking alot for exercise over the years.       Comments:

## 2017-03-26 NOTE — Progress Notes (Signed)
Daily Session Note  Patient Details  Name: Jason Craig MRN: [id number] Date of Birth: [birth year] Referring Provider:     Cardiac Rehab from 01/25/2017 in [facility] Cardiac and Pulmonary Rehab  Referring Provider  Lujean Amel MD      Encounter Date: 03/26/2017  Check In:     Session Check In - 03/26/17 0746      Check-In   Location [facility]-Cardiac & Pulmonary Rehab   Staff Present Gerlene Burdock, RN, BSN;Susanne Bice, RN, BSN, CCRP;Jessica Luan Pulling, MA, ACSM RCEP, Exercise Physiologist   Supervising physician immediately available to respond to emergencies See telemetry face sheet for immediately available ER MD   Medication changes reported     No   Fall or balance concerns reported    No   Tobacco Cessation No Change   Warm-up and Cool-down Performed on first and last piece of equipment   Resistance Training Performed Yes   VAD Patient? No     Pain Assessment   Currently in Pain? No/denies         History  Smoking Status  . Former Smoker  . Types: Cigarettes  . Quit date: 11/25/1983  Smokeless Tobacco  . Never Used    Goals Met:  Proper associated with RPD/PD & O2 Sat Exercise tolerated well No report of cardiac concerns or symptoms  Goals Unmet:  Not Applicable  Comments:     Dr. Emily Filbert is Medical Director for [facility] [facility].

## 2017-03-26 NOTE — Progress Notes (Signed)
Cardiac Individual Treatment Plan  Patient Details  Name: Jason Craig MRN: [id number] Date of Birth: 03-18-1941 Referring Provider:     Cardiac Rehab from 01/25/2017 in [facility] Cardiac and Pulmonary Rehab  Referring Provider  Lujean Amel MD      Initial Encounter Date:    Cardiac Rehab from 01/25/2017 in [facility] Cardiac and Pulmonary Rehab  Date  01/25/17  Referring Provider  Lujean Amel MD      Visit Diagnosis: No diagnosis found.  Patient's Home Medications on Admission:  Current Outpatient Prescriptions:  .  aspirin EC 81 MG EC tablet, Take 1 tablet (81 mg total) by mouth daily., Disp: 30 tablet, Rfl: 0 .  atorvastatin (LIPITOR) 40 MG tablet, Take 1 tablet (40 mg total) by mouth daily at 6 PM., Disp: 90 tablet, Rfl: 1 .  Calcium Citrate-Vitamin D (CALCIUM + D PO), Take 1 tablet by mouth daily., Disp: , Rfl:  .  clopidogrel (PLAVIX) 75 MG tablet, Take 1 tablet (75 mg total) by mouth daily with breakfast., Disp: 30 tablet, Rfl: 0 .  Coenzyme Q10 (COQ10 PO), Take 1 capsule by mouth daily., Disp: , Rfl:  .  latanoprost (XALATAN) 0.005 % ophthalmic solution, Place 1 drop into both eyes at bedtime., Disp: , Rfl: 0 .  losartan (COZAAR) 25 MG tablet, Take 25 mg by mouth daily., Disp: , Rfl:  .  metoprolol tartrate (LOPRESSOR) 50 MG tablet, Take 0.5 tablets (25 mg total) by mouth 2 (two) times daily., Disp: 60 tablet, Rfl: 0 .  Multiple Vitamin (MULTIVITAMIN WITH MINERALS) TABS tablet, Take 1 tablet by mouth daily., Disp: , Rfl:  .  multivitamin-lutein (OCUVITE-LUTEIN) CAPS capsule, Take 1 capsule by mouth daily., Disp: , Rfl:  .  Omega-3 Fatty Acids (FISH OIL) 1000 MG CAPS, Take 1 capsule (1,000 mg total) by mouth daily., Disp: 30 capsule, Rfl: 0 .  Omega-3 Fatty Acids (SALMON OIL-1000 PO), Take 2,000 mg by mouth., Disp: , Rfl:  .  Probiotic Product (PROBIOTIC PO), Take 1 capsule by mouth daily., Disp: , Rfl:   Past Medical History: Past Medical History:  Diagnosis Date  .  Hypertension   . 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 Rib fracture 07/11/2015   Horse Accident     Tobacco Use: History  Smoking Status  . Former Smoker  . Types: Cigarettes  . Quit date: 11/25/1983  Smokeless Tobacco  . Never Used    Labs: Recent Review Flowsheet Data    Labs for ITP Cardiac and Pulmonary Rehab Latest Ref Rng & Units 12/27/2016 02/25/2017   Cholestrol <200 mg/dL 91 75   LDLCALC <100 mg/dL 39 22   HDL >40 mg/dL 29(L) 38(L)   Trlycerides <150 mg/dL 117 76   Hemoglobin A1c 4.8 - 5.6 % 5.7(H) -       Exercise Target Goals:    Exercise Program Goal: Individual exercise prescription set with THRR, safety & activity barriers. Participant demonstrates ability to understand and report RPE using BORG scale, to self-measure pulse accurately, and to acknowledge the importance of the exercise prescription.  Exercise Prescription Goal: Starting with aerobic activity 30 plus minutes a day, 3 days per week for initial exercise prescription. Provide home exercise prescription and guidelines that participant acknowledges understanding prior to discharge.  Activity Barriers & Risk Stratification:     Activity Barriers & Cardiac Risk Stratification - 01/25/17 1318      Activity Barriers & Cardiac Risk Stratification  Activity Barriers None   Cardiac Risk Stratification High      6 Minute Walk:     6 Minute Walk    Row Name 01/25/17 1426 03/19/17 0939       6 Minute Walk   Phase Initial Discharge    Distance 1870 feet 2170 feet    Distance % Change  - 16 %  300 ft    Walk Time 6 minutes 6 minutes    # of Rest Breaks 0 0    MPH 3.54 4.11    METS 3.95 4.53    RPE 9 12    VO2 Peak 13.82 15.85    Symptoms No No    Resting HR 54 bpm 75 bpm    Resting BP 116/64 138/70    Max Ex. HR 127 bpm 120 bpm    Max Ex. BP 126/64 144/60    2 Minute Post BP 122/60  -       Oxygen Initial Assessment:   Oxygen  Re-Evaluation:   Oxygen Discharge (Final Oxygen Re-Evaluation):   Initial Exercise Prescription:     Initial Exercise Prescription - 01/25/17 1400      Date of Initial Exercise RX and Referring Provider   Date 01/25/17   Referring Provider Lujean Amel MD     Treadmill   MPH 3.5   Grade 0.5   Minutes 15   METs 3.68     NuStep   Level 3   SPM 100   Minutes 15   METs 3     REL-XR   Level 3   Speed 50   Minutes 15   METs 3     Prescription Details   Frequency (times per week) 3   Duration Progress to 45 minutes of aerobic exercise without signs/symptoms of physical distress     Intensity   THRR 40-80% of Max Heartrate 90-125   Ratings of Perceived Exertion 11-13   Perceived Dyspnea 0-4     Progression   Progression Continue to progress workloads to maintain intensity without signs/symptoms of physical distress.     Resistance Training   Training Prescription Yes   Weight 3 lbs      Perform Capillary Blood Glucose checks as needed.  Exercise Prescription Changes:     Exercise Prescription Changes    Row Name 01/25/17 1400 02/08/17 1000 02/10/17 1600 02/25/17 1200 03/11/17 1400     Response to Exercise   Blood Pressure (Admit) 116/64  - 106/54 138/62 124/70   Blood Pressure (Exercise) 126/64  - 144/60 136/70 140/64   Blood Pressure (Exit) 122/60  - 124/68 112/60 130/64   Heart Rate (Admit) 54 bpm  - 74 bpm 49 bpm 66 bpm   Heart Rate (Exercise) 127 bpm  - 105 bpm 98 bpm 134 bpm   Heart Rate (Exit) 58 bpm  - 54 bpm 64 bpm 58 bpm   Oxygen Saturation (Admit) 99 %  -  -  -  -   Oxygen Saturation (Exercise) 98 %  -  -  -  -   Rating of Perceived Exertion (Exercise) 9  - '12 12 12   ' Symptoms none none none none none   Comments walk test results  -  -  -  -   Duration  -  - Continue with 45 min of aerobic exercise without signs/symptoms of physical distress. Continue with 45 min of aerobic exercise without signs/symptoms of physical distress. Continue with  45 min of aerobic exercise  without signs/symptoms of physical distress.   Intensity  -  - THRR unchanged THRR unchanged THRR unchanged     Progression   Progression  -  - Continue to progress workloads to maintain intensity without signs/symptoms of physical distress. Continue to progress workloads to maintain intensity without signs/symptoms of physical distress. Continue to progress workloads to maintain intensity without signs/symptoms of physical distress.   Average METs  -  - 3.97 4.87 5.27     Resistance Training   Training Prescription  - Yes Yes Yes Yes   Weight  - 3 lbs 3 lbs 4 lbs 5 lbs   Reps  -  - 10-15 10-15 10-15     Interval Training   Interval Training  -  - No No No     Treadmill   MPH  - 3.5 3.5 3.8 4   Grade  - 0.5 0.'5 2 4   ' Minutes  - '15 15 15 15   ' METs  - 3.68 3.92 4.96 7.1     NuStep   Level  - '3 6 7 7   ' SPM  - 100 100 100  -   Minutes  - '15 15 15 15   ' METs  - 3 4 5.1 4     REL-XR   Level  - 3 4 4.5 5   Speed  - 50  -  -  -   Minutes  - '15 15 15 15   ' METs  - 3 4 3.4 4.3     Home Exercise Plan   Plans to continue exercise at  - Home (comment)  walking Home (comment)  walking Home (comment)  walking Home (comment)  walking   Frequency  - Add 3 additional days to program exercise sessions. Add 3 additional days to program exercise sessions. Add 3 additional days to program exercise sessions. Add 3 additional days to program exercise sessions.   Initial Home Exercises Provided  - 02/08/17 02/08/17 02/08/17 02/08/17   Row Name 03/22/17 1600             Response to Exercise   Blood Pressure (Admit) 138/82       Blood Pressure (Exercise) 134/60       Blood Pressure (Exit) 124/60       Heart Rate (Admit) 56 bpm       Heart Rate (Exercise) 113 bpm       Heart Rate (Exit) 82 bpm       Rating of Perceived Exertion (Exercise) 12       Symptoms none       Duration Continue with 45 min of aerobic exercise without signs/symptoms of physical distress.        Intensity THRR unchanged         Progression   Progression Continue to progress workloads to maintain intensity without signs/symptoms of physical distress.       Average METs 6.81         Resistance Training   Training Prescription Yes       Weight 5 lbs       Reps 10-15         Interval Training   Interval Training Yes       Equipment Recumbant Elliptical;NuStep       Comments 30 sec on         Treadmill   MPH 4       Grade 4       Minutes 15  METs 6.27         NuStep   Level 7       Minutes 15       METs 4.3         REL-XR   Level 5       Minutes 15       METs 10         Home Exercise Plan   Plans to continue exercise at Home (comment)  walking       Frequency Add 3 additional days to program exercise sessions.       Initial Home Exercises Provided 02/08/17          Exercise Comments:     Exercise Comments    Row Name 01/29/17 0756 02/03/17 0915 02/12/17 0846 03/01/17 0840 03/15/17 9935   Exercise Comments First full day of exercise!  Patient was oriented to gym and equipment including functions, settings, policies, and procedures.  Patient's individual exercise prescription and treatment plan were reviewed.  All starting workloads were established based on the results of the 6 minute walk test done at initial orientation visit.  The plan for exercise progression was also introduced and progression will be customized based on patient's performance and goals. Switched from XR to REL permanently due to availability. Reviewed METs average and discussed progression with pt today. Reviewed METs average and discussed progression with pt today. Reviewed METs average and discussed progression with pt today.      Exercise Goals and Review:     Exercise Goals    Row Name 01/25/17 1430             Exercise Goals   Increase Physical Activity Yes       Intervention Provide advice, education, support and counseling about physical activity/exercise  needs.;Develop an individualized exercise prescription for aerobic and resistive training based on initial evaluation findings, risk stratification, comorbidities and participant's personal goals.       Expected Outcomes Achievement of increased cardiorespiratory fitness and enhanced flexibility, muscular endurance and strength shown through measurements of functional capacity and personal statement of participant.       Increase Strength and Stamina Yes       Intervention Provide advice, education, support and counseling about physical activity/exercise needs.;Develop an individualized exercise prescription for aerobic and resistive training based on initial evaluation findings, risk stratification, comorbidities and participant's personal goals.       Expected Outcomes Achievement of increased cardiorespiratory fitness and enhanced flexibility, muscular endurance and strength shown through measurements of functional capacity and personal statement of participant.          Exercise Goals Re-Evaluation :     Exercise Goals Re-Evaluation    Row Name 02/08/17 1020 02/10/17 1613 02/25/17 1208 03/11/17 1445 03/19/17 0940     Exercise Goal Re-Evaluation   Exercise Goals Review Increase Physical Activity;Increase Strenth and Stamina Increase Physical Activity;Increase Strenth and Stamina Increase Physical Activity;Increase Strenth and Stamina Increase Physical Activity;Increase Strenth and Stamina Increase Physical Activity;Increase Strenth and Stamina   Comments Reviewed home exercise with pt today.  Pt plans to continue walking at home for exercise.  Reviewed THR, pulse, RPE, sign and symptoms, and when to call 911 or MD.  Also discussed weather considerations and indoor options.  Pt voiced understanding. Jason Craig is off to a good start with rehab.  He is enjoying the REL.  He is now up to level four on both the REL and NuStep.  We will continue to monitor  his progression. Jason Craig has been doing well in  rehab.  He is now up to level 7 on the NuStep.  We will continue to monitor his progression. Jason Craig continues to do well in rehab.  He is now doing 4 mph with a 4% incline on the treadmill!  He is also up to level 5 on the recumbent elliptical!  We will continue to monitor his progression. Jason Craig improved his walk test by 16%!!   Expected Outcomes Short and Long: Continue to walk at home for exercise to increase strength and stamina Short: Jason Craig will continue to increase workloads and add more incline on treadmill.  Long: Continue to come to class to work on strength and stamina. Short: We will discuss adding in interval training.  Long: Continue to work on IT sales professional. Short: Continue to encourage Jason Craig to try the intervals versus just moving up workloads.  Long: Continue to make exercise part of his routine.  -   Row Name 03/22/17 1607             Exercise Goal Re-Evaluation   Exercise Goals Review Increase Physical Activity;Increase Strenth and Stamina       Comments Jason Craig will be graduating on Friday!  He has started some intervals on the REL and the NuStep, just not with a consistent time in between.  He has already started going to the gym on his off days and plans to continue daily!       Expected Outcomes Short: Jason Craig will graduate!  Long: Continue to exercise daily at the gym!          Discharge Exercise Prescription (Final Exercise Prescription Changes):     Exercise Prescription Changes - 03/22/17 1600      Response to Exercise   Blood Pressure (Admit) 138/82   Blood Pressure (Exercise) 134/60   Blood Pressure (Exit) 124/60   Heart Rate (Admit) 56 bpm   Heart Rate (Exercise) 113 bpm   Heart Rate (Exit) 82 bpm   Rating of Perceived Exertion (Exercise) 12   Symptoms none   Duration Continue with 45 min of aerobic exercise without signs/symptoms of physical distress.   Intensity THRR unchanged     Progression   Progression Continue to progress workloads to maintain  intensity without signs/symptoms of physical distress.   Average METs 6.81     Resistance Training   Training Prescription Yes   Weight 5 lbs   Reps 10-15     Interval Training   Interval Training Yes   Equipment Recumbant Elliptical;NuStep   Comments 30 sec on     Treadmill   MPH 4   Grade 4   Minutes 15   METs 6.27     NuStep   Level 7   Minutes 15   METs 4.3     REL-XR   Level 5   Minutes 15   METs 10     Home Exercise Plan   Plans to continue exercise at Home (comment)  walking   Frequency Add 3 additional days to program exercise sessions.   Initial Home Exercises Provided 02/08/17      Nutrition:  Target Goals: Understanding of nutrition guidelines, daily intake of sodium <151m, cholesterol <2044m calories 30% from fat and 7% or less from saturated fats, daily to have 5 or more servings of fruits and vegetables.  Biometrics:     Pre Biometrics - 03/19/17 0947      Pre Biometrics   Height 6' 0.3" (1.836 m)  Weight 193 lb (87.5 kg)   Waist Circumference 40 inches   Hip Circumference 38.5 inches   Waist to Hip Ratio 1.04 %   BMI (Calculated) 26   Single Leg Stand 26.51 seconds       Nutrition Therapy Plan and Nutrition Goals:     Nutrition Therapy & Goals - 02/12/17 0751      Personal Nutrition Goals   Nutrition Goal Continue with current eating pattern   Personal Goal #2 ideally include protein food with breakfast and lunch as well as supper.      Nutrition Discharge: Rate Your Plate Scores:     Nutrition Assessments - 03/19/17 0941      MEDFICTS Scores   Pre Score 49   Post Score 39   Score Difference -10      Nutrition Goals Re-Evaluation:     Nutrition Goals Re-Evaluation    Row Name 02/12/17 0753 03/26/17 0803           Goals   Current Weight  - 193 lb (87.5 kg)      Nutrition Goal Continue with current eating pattern  -      Comment Continues to eat as has been, adding some more meat to meals.  Stated may try  protein drink during day since does not eat protein item at breakfast Has added a protein to his breakfast most morningt.      Expected Outcome Continue with nutrition plan as is with more protein by more meat selection or use of the protein drink  -        Personal Goal #2 Re-Evaluation   Personal Goal #2 ideally include protein food with breakfast and lunch as well as supper. Has increased his protein intake.         Nutrition Goals Discharge (Final Nutrition Goals Re-Evaluation):     Nutrition Goals Re-Evaluation - 03/26/17 0803      Goals   Current Weight 193 lb (87.5 kg)   Comment Has added a protein to his breakfast most morningt.     Personal Goal #2 Re-Evaluation   Personal Goal #2 Has increased his protein intake.      Psychosocial: Target Goals: Acknowledge presence or absence of significant depression and/or stress, maximize coping skills, provide positive support system. Participant is able to verbalize types and ability to use techniques and skills needed for reducing stress and depression.   Initial Review & Psychosocial Screening:     Initial Psych Review & Screening - 01/25/17 1211      Initial Review   Current issues with None Identified     Family Dynamics   Good Support System? Yes     Barriers   Psychosocial barriers to participate in program There are no identifiable barriers or psychosocial needs.     Screening Interventions   Interventions Encouraged to exercise      Quality of Life Scores:      Quality of Life - 03/19/17 0941      Quality of Life Scores   Health/Function Pre 30 %   Health/Function Post 29.2 %   Health/Function % Change -2.67 %   Socioeconomic Pre 30 %   Socioeconomic Post 30 %   Socioeconomic % Change  0 %   Psych/Spiritual Pre 30 %   Psych/Spiritual Post 30 %   Psych/Spiritual % Change 0 %   Family Pre 30 %   Family Post 30 %   Family % Change 0 %   GLOBAL Pre  30 %   GLOBAL Post 29.65 %   GLOBAL % Change -1.17 %       PHQ-9: Recent Review Flowsheet Data    Depression screen [facility] 2/9 03/19/2017 02/25/2017 01/25/2017 08/27/2016 08/22/2015   Decreased Interest 0 0 0 0 0   Down, Depressed, Hopeless 0 0 0 0 0   PHQ - 2 Score 0 0 0 0 0   Altered sleeping 0 - 0 - -   Tired, decreased energy 0 - 0 - -   Change in appetite 0 - 0 - -   Feeling bad or failure about yourself  0 - 0 - -   Trouble concentrating 0 - 0 - -   Moving slowly or fidgety/restless 0 - 0 - -   Suicidal thoughts 0 - 0 - -   PHQ-9 Score 0 - 0 - -     Interpretation of Total Score  Total Score Depression Severity:  1-4 = Minimal depression, 5-9 = Mild depression, 10-14 = Moderate depression, 15-19 = Moderately severe depression, 20-27 = Severe depression   Psychosocial Evaluation and Intervention:     Psychosocial Evaluation - 03/26/17 0748      Psychosocial Evaluation & Interventions   Interventions Therapist referral      Psychosocial Re-Evaluation:     Psychosocial Re-Evaluation    Row Name 02/12/17 0755 03/26/17 0759           Psychosocial Re-Evaluation   Current issues with None Identified  -      Comments  - Jeneen Rinks "Jason Craig' said he has "really enjoyed this (Cardiac Rehab) and 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 [place] said he does not feel he has any stress since he has realized somethings like having a heart attack is out of his control but he knows to keep exercising.      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 Jason Craig said he does not feel he has any  stress since he has realized somethings like having a heart attack is out of his control but he knows to keep exercising.      Vocational Rehabilitation: Provide vocational rehab assistance to qualifying candidates.   Vocational Rehab Evaluation & Intervention:     Vocational Rehab - 01/25/17 1316      Initial Vocational Rehab Evaluation & Intervention   Assessment shows need for Vocational Rehabilitation No      Education: Education Goals: Education classes will be provided on a weekly basis, covering required topics. Participant will state understanding/return demonstration of topics presented.  Learning Barriers/Preferences:     Learning Barriers/Preferences - 01/25/17 1316      Learning Barriers/Preferences   Learning Barriers None   Learning Preferences Individual Instruction      Education Topics: General Nutrition Guidelines/Fats and Fiber: -Group instruction provided by verbal, written material, models and posters to present the general guidelines for heart healthy nutrition. Gives an explanation and review of dietary fats and fiber.   Cardiac Rehab from 03/24/2017 in  [facility] [facility]iac and Pulmonary Rehab  Date  03/08/17  Educator  CR  Instruction Review Code  2- meets goals/outcomes      Controlling Sodium/Reading Food Labels: -Group verbal and written material supporting the discussion of sodium use in heart healthy nutrition. Review and explanation with models, verbal and written materials for utilization of the food label.   Cardiac Rehab from 03/24/2017 in [facility] Cardiac and Pulmonary Rehab  Date  03/15/17  Educator  CR  Instruction Review Code  2- meets goals/outcomes      Exercise Physiology & Risk Factors: - Group verbal and written instruction with models to review the exercise physiology of the cardiovascular system and associated critical values. Details cardiovascular disease risk factors and the goals associated with each risk factor.   Cardiac Rehab  from 03/24/2017 in [facility] Cardiac and Pulmonary Rehab  Date  03/22/17  Educator  [facility]  Instruction Review Code  2- meets goals/outcomes      Aerobic Exercise & Resistance Training: - Gives group verbal and written discussion on the health impact of inactivity. On the components of aerobic and resistive training programs and the benefits of this training and how to safely progress through these programs.   Cardiac Rehab from 03/24/2017 in [facility] Cardiac and Pulmonary Rehab  Date  03/24/17  Educator  [facility]  Instruction Review Code  2- meets goals/outcomes      Flexibility, Balance, General Exercise Guidelines: - Provides group verbal and written instruction on the benefits of flexibility and balance training programs. Provides general exercise guidelines with specific guidelines to those with heart or lung disease. Demonstration and skill practice provided.   Cardiac Rehab from 03/24/2017 in [facility] Cardiac and Pulmonary Rehab  Date  02/01/17  Educator  [facility]  Instruction Review Code  2- meets goals/outcomes      Stress Management: - Provides group verbal and written instruction about the health risks of elevated stress, cause of high stress, and healthy ways to reduce stress.   Cardiac Rehab from 03/24/2017 in [facility] Cardiac and Pulmonary Rehab  Date  02/10/17  Educator  [facility]  Instruction Review Code  2- meets goals/outcomes      Depression: - Provides group verbal and written instruction on the correlation between heart/lung disease and depressed mood, treatment options, and the stigmas associated with seeking treatment.   Cardiac Rehab from 03/24/2017 in [facility] Cardiac and Pulmonary Rehab  Date  03/10/17  Educator  [facility]  Instruction Review Code  2- meets goals/outcomes      Anatomy & Physiology of the Heart: - Group verbal and written instruction and models provide basic cardiac anatomy and physiology, with the coronary electrical and arterial systems. Review of: AMI, Angina, Valve disease, Heart  Failure, Cardiac Arrhythmia, Pacemakers, and the ICD.   Cardiac Rehab from 03/24/2017 in [facility] Cardiac and Pulmonary Rehab  Date  02/08/17  Educator  CE  Instruction Review Code  2- meets goals/outcomes      Cardiac Procedures: - Group verbal and written instruction and models to describe the testing methods done to diagnose heart disease. Reviews the outcomes of the test results. Describes the treatment choices: Medical Management, Angioplasty, or Coronary Bypass Surgery.   Cardiac Rehab from 03/24/2017 in [facility] Cardiac and Pulmonary Rehab  Date  02/15/17  Educator  CE  Instruction Review Code  2- meets goals/outcomes      Cardiac Medications: - Group verbal and written instruction to review commonly prescribed medications for heart disease. Reviews the medication, class of the drug,  and side effects. Includes the steps to properly store meds and maintain the prescription regimen.   Cardiac Rehab from 03/24/2017 in [facility] Cardiac and Pulmonary Rehab  Date  02/24/17  Educator  SB  Instruction Review Code  2- meets goals/outcomes [part one]      Go Sex-Intimacy & Heart Disease, Get SMART - Goal Setting: - Group verbal and written instruction through game format to discuss heart disease and the return to sexual intimacy. Provides group verbal and written material to discuss and apply goal setting through the application of the S.M.A.R.T. Method.   Cardiac Rehab from 03/24/2017 in [facility] Cardiac and Pulmonary Rehab  Date  02/15/17  Educator  CE  Instruction Review Code  2- meets goals/outcomes      Other Matters of the Heart: - Provides group verbal, written materials and models to describe Heart Failure, Angina, Valve Disease, and Diabetes in the realm of heart disease. Includes description of the disease process and treatment options available to the cardiac patient.   Cardiac Rehab from 03/24/2017 in [facility] Cardiac and Pulmonary Rehab  Date  02/08/17  Educator  CE  Instruction Review Code   2- meets goals/outcomes      Exercise & Equipment Safety: - Individual verbal instruction and demonstration of equipment use and safety with use of the equipment.   Cardiac Rehab from 03/24/2017 in [facility] Cardiac and Pulmonary Rehab  Date  01/25/17  Educator  C. EnterkinRN  Instruction Review Code  1- partially meets, needs review/practice      Infection Prevention: - Provides verbal and written material to individual with discussion of infection control including proper hand washing and proper equipment cleaning during exercise session.   Cardiac Rehab from 03/24/2017 in [facility] Cardiac and Pulmonary Rehab  Date  01/25/17  Educator  C. EnterkinRN  Instruction Review Code  2- meets goals/outcomes      Falls Prevention: - Provides verbal and written material to individual with discussion of falls prevention and safety.   Cardiac Rehab from 03/24/2017 in [facility] Cardiac and Pulmonary Rehab  Date  01/25/17  Educator  C. Daveena Elmore Therapist, sports  Instruction Review Code  2- meets goals/outcomes      Diabetes: - Individual verbal and written instruction to review signs/symptoms of diabetes, desired ranges of glucose level fasting, after meals and with exercise. Advice that pre and post exercise glucose checks will be done for 3 sessions at entry of program.    Knowledge Questionnaire Score:     Knowledge Questionnaire Score - 03/19/17 0942      Knowledge Questionnaire Score   Pre Score 24/28   Post Score 27/28      Core Components/Risk Factors/Patient Goals at Admission:     Personal Goals and Risk Factors at Admission - 01/25/17 1318      Core Components/Risk Factors/Patient Goals on Admission    Weight Management Weight Loss;Yes   Intervention Weight Management: Develop a combined nutrition and exercise program designed to reach desired caloric intake, while maintaining appropriate intake of nutrient and fiber, sodium and fats, and appropriate energy expenditure required for the weight  goal.;Weight Management: Provide education and appropriate resources to help participant work on and attain dietary goals.   Admit Weight 197 lb (89.4 kg)   Goal Weight: Short Term 195 lb (88.5 kg)   Goal Weight: Long Term 190 lb (86.2 kg)   Expected Outcomes Short Term: Continue to assess and modify interventions until short term weight is achieved;Weight Loss: Understanding of general recommendations for a  balanced deficit meal plan, which promotes 1-2 lb weight loss per week and includes a negative energy balance of [phone] kcal/d;Understanding recommendations for meals to include 15-35% energy as protein, 25-35% energy from fat, 35-60% energy from carbohydrates, less than 262m of dietary cholesterol, 20-35 gm of total fiber daily;Understanding of distribution of calorie intake throughout the day with the consumption of 4-5 meals/snacks;Long Term: Adherence to nutrition and physical activity/exercise program aimed toward attainment of established weight goal   Hypertension Yes   Intervention Provide education on lifestyle modifcations including regular physical activity/exercise, weight management, moderate sodium restriction and increased consumption of fresh fruit, vegetables, and low fat dairy, alcohol moderation, and smoking cessation.;Monitor prescription use compliance.   Expected Outcomes Short Term: Continued assessment and intervention until BP is < 140/940mHG in hypertensive participants. < 130/8067mG in hypertensive participants with diabetes, heart failure or chronic kidney disease.;Long Term: Maintenance of blood pressure at goal levels.   Lipids Yes   Intervention Provide education and support for participant on nutrition & aerobic/resistive exercise along with prescribed medications to achieve LDL <38m38mDL >40mg47mExpected Outcomes Short Term: Participant states understanding of desired cholesterol values and is compliant with medications prescribed. Participant is following  exercise prescription and nutrition guidelines.;Long Term: Cholesterol controlled with medications as prescribed, with individualized exercise RX and with personalized nutrition plan. Value goals: LDL < 38mg,82m > 40 mg.      Core Components/Risk Factors/Patient Goals Review:      Goals and Risk Factor Review    Row Name 02/12/17 0756 03/26/17 0747 03/26/17 0755 03/26/17 0756       Core Components/Risk Factors/Patient Goals Review   Personal Goals Review Weight Management/Obesity;Hypertension;Lipids  -  -  -    Review Jason Craig is new to the program, working on nutrition goals, compliant with his meds and is aware of sodium intake. WEight 193lbs today. Blood pressure today was 132/64 "Jason Craig" met his short term goal of weight loss to 193lbs. His cholestrol levels are good and he said Dr. CallwoClayborn Bignesstly told him to stay on his cholestrol lowering medicines. Jason Craig Jason Peacehe has 94 yea58old brother who is doing well. Kaycee Hardeny"Jason Peaceoping to take a CPR AED class that will be offered at his church by the [facility] [employer]     Expected Outcomes Continued control of lifestyle to reach and maintian risk factor goals  -  - Continue heart healthy living since he will complete 36/36 Cardiac Rehab sessions today.        Core Components/Risk Factors/Patient Goals at Discharge (Final Review):      Goals and Risk Factor Review - 03/26/17 0756      Core Components/Risk Factors/Patient Goals Review   Review Jason Craig Jason Peacehe has 94 yea64old brother who is doing well. Bracen Mosiahy"Jason Peaceoping to take a CPR AED class that will be offered at his church by the [facility] [employer].    Expected Outcomes Continue heart healthy living since he will complete 36/36 Cardiac Rehab sessions today.       ITP Comments:     ITP Comments    Row Name 01/25/17 1316 02/03/17 0549 03/03/17 0546 03/26/17 0753     ITP Comments ITP created during Medical Review. [facility]ation Dx  [facility] 12/28/2016 30 day review. Continue with ITP unless directed changes per Medical Director review 30 day review. Continue with ITP unless directed changes per Medical  Director review Jason Craig" said he doesn't know why he had a heart attack since his cholestrol is good but he said Dr. Clayborn Bigness told him to stay on his cholestrol lowering medicines. Jason Craig reports he has been walking alot for exercise over the years.       Comments:

## 2017-06-29 DIAGNOSIS — H353114 Nonexudative age-related macular degeneration, right eye, advanced atrophic with subfoveal involvement: Secondary | ICD-10-CM | POA: Diagnosis not present

## 2017-06-29 DIAGNOSIS — H353222 Exudative age-related macular degeneration, left eye, with inactive choroidal neovascularization: Secondary | ICD-10-CM | POA: Diagnosis not present

## 2017-07-27 ENCOUNTER — Telehealth: Payer: Self-pay | Admitting: Nurse Practitioner

## 2017-07-27 NOTE — Telephone Encounter (Signed)
Called pt to schedule for Annual Wellness Visit with Nurse Health Advisor, Tiffany Hill, my c/b # is [phone]  Kathryn Brown ° °

## 2017-08-09 DIAGNOSIS — I228 Subsequent ST elevation (STEMI) myocardial infarction of other sites: Secondary | ICD-10-CM | POA: Diagnosis not present

## 2017-08-09 DIAGNOSIS — R011 Cardiac murmur, unspecified: Secondary | ICD-10-CM | POA: Diagnosis not present

## 2017-08-09 DIAGNOSIS — I208 Other forms of angina pectoris: Secondary | ICD-10-CM | POA: Diagnosis not present

## 2017-08-09 DIAGNOSIS — I25118 Atherosclerotic heart disease of native coronary artery with other forms of angina pectoris: Secondary | ICD-10-CM | POA: Diagnosis not present

## 2017-08-09 DIAGNOSIS — E785 Hyperlipidemia, unspecified: Secondary | ICD-10-CM | POA: Diagnosis not present

## 2017-08-24 DIAGNOSIS — Z961 Presence of intraocular lens: Secondary | ICD-10-CM | POA: Diagnosis not present

## 2017-08-24 DIAGNOSIS — H353223 Exudative age-related macular degeneration, left eye, with inactive scar: Secondary | ICD-10-CM | POA: Diagnosis not present

## 2017-08-24 DIAGNOSIS — H52223 Regular astigmatism, bilateral: Secondary | ICD-10-CM | POA: Diagnosis not present

## 2017-08-24 DIAGNOSIS — H353113 Nonexudative age-related macular degeneration, right eye, advanced atrophic without subfoveal involvement: Secondary | ICD-10-CM | POA: Diagnosis not present

## 2017-08-24 DIAGNOSIS — H401131 Primary open-angle glaucoma, bilateral, mild stage: Secondary | ICD-10-CM | POA: Diagnosis not present

## 2017-08-27 ENCOUNTER — Ambulatory Visit (INDEPENDENT_AMBULATORY_CARE_PROVIDER_SITE_OTHER): Payer: Medicare Other | Admitting: Family Medicine

## 2017-08-27 ENCOUNTER — Other Ambulatory Visit: Payer: Self-pay | Admitting: Family Medicine

## 2017-08-27 ENCOUNTER — Encounter: Payer: Self-pay | Admitting: Family Medicine

## 2017-08-27 VITALS — BP 134/60 | HR 53 | Temp 98.0°F | Resp 16 | Ht 70.0 in | Wt 203.0 lb

## 2017-08-27 DIAGNOSIS — Z23 Encounter for immunization: Secondary | ICD-10-CM | POA: Diagnosis not present

## 2017-08-27 DIAGNOSIS — I10 Essential (primary) hypertension: Secondary | ICD-10-CM

## 2017-08-27 DIAGNOSIS — D508 Other iron deficiency anemias: Secondary | ICD-10-CM

## 2017-08-27 DIAGNOSIS — Z125 Encounter for screening for malignant neoplasm of prostate: Secondary | ICD-10-CM

## 2017-08-27 DIAGNOSIS — R7309 Other abnormal glucose: Secondary | ICD-10-CM

## 2017-08-27 DIAGNOSIS — I252 Old myocardial infarction: Secondary | ICD-10-CM

## 2017-08-27 DIAGNOSIS — E782 Mixed hyperlipidemia: Secondary | ICD-10-CM

## 2017-08-27 DIAGNOSIS — I2510 Atherosclerotic heart disease of native coronary artery without angina pectoris: Secondary | ICD-10-CM | POA: Diagnosis not present

## 2017-08-27 DIAGNOSIS — D649 Anemia, unspecified: Secondary | ICD-10-CM | POA: Insufficient documentation

## 2017-08-27 DIAGNOSIS — E785 Hyperlipidemia, unspecified: Secondary | ICD-10-CM | POA: Insufficient documentation

## 2017-08-27 LAB — POCT GLYCOSYLATED HEMOGLOBIN (HGB A1C): HEMOGLOBIN A1C: 5.5 (ref ?–5.7)

## 2017-08-27 NOTE — Progress Notes (Signed)
Subjective:    Patient ID: Jason Craig, male    DOB: 08/08/1941, 76 y.o.   MRN: [id number]  Jason Craig is a 76 y.o. male presenting on 08/27/2017 for Hypertension and Hyperlipidemia   HPI   CHRONIC HTN: Reports followed by Dr Clayborn Bigness, 110/60 recently. Often when check at doctors office usually mild elevated. Check BP at home 110-120 Current Meds - Losartan 25mg  daily, Metoprolol 25mg  BID (half of 50mg  tab)   Reports good compliance, took meds today. Tolerating well, w/o complaints. Denies CP, dyspnea, HA, edema, dizziness / lightheadedness  CAD, history of STEMI (12/2016), s/p stent, without angina // Dyslipidemia vs Mixed Hyperlipidemia - Followed by [facility] Card[facility] Dr Clayborn Bigness. Recently seen 08/2017, doing well by report. He had stent placed in February 2018 when treated for STEMI. 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  PMH - additional clarifications on old record: - No prior history of depression - No prior history of spinal cord injury, he had multiple rib fractures with vertebral fracture, all since resolved and healed  Health Maintenance: - Due for Flu Shot, will receive today - Declined TDap  Depression screen [facility] 2/9 03/19/2017 02/25/2017 01/25/2017  Decreased Interest 0 0 0  Down, Depressed, Hopeless 0 0 0  PHQ - 2 Score 0 0 0  Altered sleeping 0 - 0  Tired,  decreased energy 0 - 0  Change in appetite 0 - 0  Feeling bad or failure about yourself  0 - 0  Trouble concentrating 0 - 0  Moving slowly or fidgety/restless 0 - 0  Suicidal thoughts 0 - 0  PHQ-9 Score 0 - 0    Social History  Substance Use Topics  . Smoking status: Former Smoker    Types: Cigarettes    Quit date: 11/25/1983  . Smokeless tobacco: Never Used  . Alcohol use No    Review of Systems Per HPI unless specifically indicated above     Objective:    BP 134/60 (BP Location: Left Arm, Cuff Size: Normal)   Pulse (!) 53   Temp 98 F (36.7 C) (Oral)   Resp 16   Ht 5\' 10"  (1.778 m)   Wt 203 lb (92.1 kg)   BMI 29.13 kg/m   Wt Readings from Last 3 Encounters:  08/27/17 203 lb (92.1 kg)  03/19/17 193 lb (87.5 kg)  02/25/17 197 lb (89.4 kg)    Physical Exam  Constitutional: He is oriented to person, place, and time. He appears well-developed and well-nourished. No distress.  Well-appearing, comfortable, cooperative  HENT:  Head: Normocephalic and atraumatic.  Mouth/Throat: Oropharynx is clear and moist.  Eyes: Conjunctivae are normal. Right eye exhibits no discharge. Left eye exhibits no discharge.  Neck: Normal range of motion. Neck supple. No thyromegaly present.  No carotid bruits  Cardiovascular: Normal rate, regular rhythm, normal heart sounds and intact distal pulses.   No murmur heard. Pulmonary/Chest: Effort normal  and breath sounds normal. No respiratory distress. He has no wheezes. He has no rales.  Musculoskeletal: Normal range of motion. He exhibits no edema.  Lymphadenopathy:    He has no cervical adenopathy.  Neurological: He is alert and oriented to person, place, and time.  Skin: Skin is warm and dry. No rash noted. He is not diaphoretic. No erythema.  Psychiatric: He has a normal mood and affect. His behavior is normal.  Well groomed, good eye contact, normal speech and thoughts  Nursing note and vitals reviewed.   Recent Labs  12/27/16 0309  08/27/17 1303  HGBA1C 5.7* 5.5    Results for orders placed or performed in visit on 08/27/17  POCT glycosylated hemoglobin (Hb A1C)  Result Value Ref Range   Hemoglobin A1C 5.5 5.7      Assessment & Plan:   Problem List Items Addressed This Visit    CAD (coronary artery disease)    Stable, without angina. S/p PCI stent in 12/2016 after STEMI Followed by [facility] Cardiology, on med management now, DAPT, BB, ARB, Statin      Elevated hemoglobin A1c - Primary    Improved I5O 5.7 to 5.5, uncertain if clear new dx Pre-DM or not, most likely just elevated A1c  Plan:  1. Not on any therapy currently - remain off 2. Encourage improved lifestyle - low carb, low sugar diet, reduce portion size, continue improving regular exercise 3. Follow-up 6 months will repeat A1c with labs annual phys      Relevant Orders   POCT glycosylated hemoglobin (Hb A1C) (Completed)   Essential hypertension    Well-controlled HTN - Home BP readings normal  Complication with CAD   Plan:  1. Continue current BP regimen Losartan 25mg  daily, Metoprolol 25mg  BID (half of 50mg ) 2. Encourage improved lifestyle - low sodium diet, regular exercise 3. Continue monitor BP outside office, bring readings to next visit, if persistently >140/90 or new symptoms notify office sooner 4. Follow-up 6 months      History of ST elevation myocardial infarction (STEMI)    Resolved, s/p stent in 12/2016 Without recurrence or complication Followed by Cardiology      Hyperlipidemia    Controlled cholesterol on statin and lifestyle Last lipid panel 02/2017 Known CAD  Plan: 1. Continue current meds - Atorvastatin 40mg  daily 2. Continue DAPT Plavix + ASA 81mg  for secondary ASCVD risk reduction 3. Encourage improved lifestyle - low carb/cholesterol, reduce portion size, continue improving regular exercise 4. Follow-up 6 months labs + annual       Other Visit Diagnoses    Needs flu shot       Relevant Orders   Flu vaccine  HIGH DOSE PF (Completed)      No orders of the defined types were placed in this encounter.    Follow up plan: Return in about 6 months (around 02/25/2018) for Annual Physical.  [name], DO [facility] 08/27/2017, 4:04 PM

## 2017-08-27 NOTE — Assessment & Plan Note (Signed)
Well-controlled HTN - Home BP readings normal  Complication with CAD   Plan:  1. Continue current BP regimen Losartan 25mg  daily, Metoprolol 25mg  BID (half of 50mg ) 2. Encourage improved lifestyle - low sodium diet, regular exercise 3. Continue monitor BP outside office, bring readings to next visit, if persistently >140/90 or new symptoms notify office sooner 4. Follow-up 6 months

## 2017-08-27 NOTE — Assessment & Plan Note (Signed)
Controlled cholesterol on statin and lifestyle Last lipid panel 02/2017 Known CAD  Plan: 1. Continue current meds - Atorvastatin 40mg  daily 2. Continue DAPT Plavix + ASA 81mg  for secondary ASCVD risk reduction 3. Encourage improved lifestyle - low carb/cholesterol, reduce portion size, continue improving regular exercise 4. Follow-up 6 months labs + annual

## 2017-08-27 NOTE — Patient Instructions (Addendum)
Thank you for coming to the clinic today.  1. Keep up the great work  2. Continue the current medicine regimen  3. Flu Shot today  4. DUE for FASTING BLOOD WORK (no food or drink after midnight before the lab appointment, only water or coffee without cream/sugar on the morning of)  SCHEDULE "Lab Only" visit in the morning at the clinic for lab draw in 6 MONTHS  - Make sure Lab Only appointment is at about 1 week before your next appointment, so that results will be available  For Lab Results, once available within 2-3 days of blood draw, you can can log in to MyChart online to view your results and a brief explanation. Also, we can discuss results at next follow-up visit.  Please schedule a Follow-up Appointment to: Return in about 6 months (around 02/25/2018) for Annual Physical.  If you have any other questions or concerns, please feel free to call the clinic or send a message through [place]. You may also schedule an earlier appointment if necessary.  Additionally, you may be receiving a survey about your experience at our clinic within a few days to 1 week by e-mail or mail. We value your feedback.  [name], DO [place]

## 2017-08-27 NOTE — Assessment & Plan Note (Signed)
Stable, without angina. S/p PCI stent in 12/2016 after STEMI Followed by [facility] Cardiology, on med management now, DAPT, BB, ARB, Statin

## 2017-08-27 NOTE — Assessment & Plan Note (Signed)
Improved Y1V 5.7 to 5.5, uncertain if clear new dx Pre-DM or not, most likely just elevated A1c  Plan:  1. Not on any therapy currently - remain off 2. Encourage improved lifestyle - low carb, low sugar diet, reduce portion size, continue improving regular exercise 3. Follow-up 6 months will repeat A1c with labs annual phys

## 2017-08-27 NOTE — Assessment & Plan Note (Signed)
Resolved, s/p stent in 12/2016 Without recurrence or complication Followed by Cardiology

## 2017-09-10 ENCOUNTER — Encounter: Payer: Self-pay | Admitting: Family Medicine

## 2017-09-10 ENCOUNTER — Ambulatory Visit (INDEPENDENT_AMBULATORY_CARE_PROVIDER_SITE_OTHER): Payer: Medicare Other | Admitting: Family Medicine

## 2017-09-10 VITALS — BP 125/52 | HR 57 | Temp 98.3°F | Resp 16 | Ht 70.0 in | Wt 197.0 lb

## 2017-09-10 DIAGNOSIS — J392 Other diseases of pharynx: Secondary | ICD-10-CM

## 2017-09-10 DIAGNOSIS — J358 Other chronic diseases of tonsils and adenoids: Secondary | ICD-10-CM

## 2017-09-10 DIAGNOSIS — R432 Parageusia: Secondary | ICD-10-CM | POA: Diagnosis not present

## 2017-09-10 NOTE — Progress Notes (Signed)
Subjective:    Patient ID: Jason Craig, male    DOB: 04-27-1941, 76 y.o.   MRN: [id number]  Jason Craig is a 76 y.o. male presenting on 09/10/2017 for dry throat (as per patient throat feels dry, sweet tastes salty and left side is swollen little)   HPI   DRY THROAT / TONSILLITH - Reports symptoms onset about 3 weeks ago with left side of throat feels more dry than right, and states taste of foods have changed sweet things now taste more salty and constantly all foods taste different now. Also describes some tonsil stones or white spots on back of tonsil throat on Left side that when they come out have a "foul odor and taste" - No sick contacts. Not tried any treatments OTC - Denies any fevers/chills sweat, nausea vomiting, stomach acid in throat, dysphagia, trouble swallowing, dyspnea throat swelling to affect breathing, lymph nodes  Health Maintenance: - UTD Flu Shot, 08/27/17  Depression screen [facility] 2/9 09/10/2017 03/19/2017 02/25/2017  Decreased Interest 0 0 0  Down, Depressed, Hopeless 0 0 0  PHQ - 2 Score 0 0 0  Altered sleeping - 0 -  Tired, decreased energy - 0 -  Change in appetite - 0 -  Feeling bad or failure about yourself  - 0 -  Trouble concentrating - 0 -  Moving slowly or fidgety/restless - 0 -  Suicidal thoughts - 0 -  PHQ-9 Score - 0 -    Social History  Substance Use Topics  . Smoking status: Former Smoker    Types: Cigarettes    Quit date: 11/25/1983  . Smokeless tobacco: Never Used  . Alcohol use No    Review of Systems Per HPI unless specifically indicated above     Objective:    BP (!) 125/52   Pulse (!) 57   Temp 98.3 F (36.8 C) (Oral)   Resp 16   Ht 5\' 10"  (1.778 m)   Wt 197 lb (89.4 kg)   BMI 28.27 kg/m   Wt Readings from Last 3 Encounters:  09/10/17 197 lb (89.4 kg)  08/27/17 203 lb (92.1 kg)  03/19/17 193 lb (87.5 kg)    Physical Exam  Constitutional: He is oriented to person, place, and time. He appears well-developed and  well-nourished. No distress.  Well-appearing, comfortable, cooperative  HENT:  Head: Normocephalic and atraumatic.  Mouth/Throat: Oropharynx is clear and moist.  Frontal / maxillary sinuses non-tender.  Nares mostly patent R has more turbinate edema compared to L without purulence or edema.  Oropharynx without erythema, exudates, edema or asymmetry. No obvious posterior pharyngeal drainage or drip, some very mild cobblestoning appearance of posterior oropharynx.  Left tonsil with deeper white tonsilith identified, no edema or erythema surrounding.  Tongue appears normal, no other lesions within mouth.  Mallampati Score 1 - Complete visualization of entire oropharynx soft palate  Eyes: Conjunctivae are normal. Right eye exhibits no discharge. Left eye exhibits no discharge.  Neck: Normal range of motion. Neck supple. No thyromegaly present.  Non tender  Cardiovascular: Normal rate.   Pulmonary/Chest: Effort normal.  Musculoskeletal: He exhibits no edema.  Lymphadenopathy:    He has no cervical adenopathy.  Neurological: He is alert and oriented to person, place, and time.  Skin: Skin is warm and dry. No rash noted. He is not diaphoretic. No erythema.  Psychiatric: He has a normal mood and affect. His behavior is normal.  Nursing note and vitals reviewed.  Results for orders placed or  performed in visit on 08/27/17  POCT glycosylated hemoglobin (Hb A1C)  Result Value Ref Range   Hemoglobin A1C 5.5 5.7      Assessment & Plan:   Problem List Items Addressed This Visit    None    Visit Diagnoses    Dry throat    -  Primary  Possibly secondary to allergic rhinitis and post nasal drainage Recommend OTC trial Flonase 2 sprays each nare daily for 4-6 weeks, he has existing bottle at home, if not correct spray will contact us for rx    Tonsillith      Clinically consistent with L tonsillith on exam, no complication or secondary infection. Likely etiology of L sided fullness  discomfort and foul odor taste, possibly some has been expressed, still present today. - Counseling on etiology and causes of this problem - Recommend trial of OTC Peroxyl oral debridement mouthwash - Limited other options    Taste sense altered     Unclear etiology, no other neuro findings. No obvious oral etiology on exam. No new meds to trigger Seems unrelated to dry throat and tonsillith Recommend monitoring for now, and if persistent after several weeks we can consider referral to ENT for 2nd opinion      Meds ordered this encounter  Medications  . fluticasone (FLONASE) 50 MCG/ACT nasal spray    Sig: Place 2 sprays into both nostrils daily. Use for 4-6 weeks then stop and use seasonally or as needed.    Dispense:  16 g    Refill:  3    Follow up plan: Return in about 6 weeks (around 10/22/2017), or if symptoms worsen or fail to improve, for dry throat, tonsilith, taste problem.  [name], DO [facility] 09/10/2017, 9:22 AM

## 2017-09-10 NOTE — Patient Instructions (Addendum)
Thank you for coming to the clinic today.  1. Most likely a tonsil stone or tonsilith in back of throat, these can cause white spots and area larger collection of debris and can often cause discomfort and when they come out they have foul taste and smell. - Try OTC Peroxyl Mouthwash (oral debridement mouth wash) 1-2 times daily for 1 week to help clear out some of the bad bacteria and clear these - They may come back and there is not a great treatment to permanently remove them  2. For the dryness of throat - May be post nasal drainage and drip and allergies as well - Try Flonase (Fluticasone) nasal spray - nasal steroid Flonase 2 sprays in each nostril daily for 4-6 weeks, may repeat course seasonally or as needed - if you need a new rx let me know  3. For the taste sensation, I am not sure the answer, this may be related to tongue / taste buds or also nose as well - After we treat with the allergies and if this is not improved then next step would be referral to ENT for second opinion  If you notice other problems such as difficulty swallowing or swollen lump or bump in neck or reduced sensation nerve or weakness, contact office or seek more immediate help  Please schedule a Follow-up Appointment to: Return in about 6 weeks (around 10/22/2017), or if symptoms worsen or fail to improve, for dry throat, tonsilith, taste problem.  If you have any other questions or concerns, please feel free to call the clinic or send a message through [place]. You may also schedule an earlier appointment if necessary.  Additionally, you may be receiving a survey about your experience at our clinic within a few days to 1 week by e-mail or mail. We value your feedback.  [name], DO [place]

## 2017-09-21 ENCOUNTER — Telehealth: Payer: Self-pay | Admitting: Family Medicine

## 2017-09-21 DIAGNOSIS — R432 Parageusia: Secondary | ICD-10-CM | POA: Insufficient documentation

## 2017-09-21 DIAGNOSIS — J358 Other chronic diseases of tonsils and adenoids: Secondary | ICD-10-CM

## 2017-09-21 NOTE — Telephone Encounter (Signed)
Last seen by me on 09/10/17 for same problems, I am believe he is requesting referral for.  Referral to [facility] ENT for further evaluation and management of abnormal change/loss of taste, tonsillith and dry throat, last seen by our office 09/10/17. He is not improving on conservative therapy and requesting 2nd opinion by ENT   Order placed.  [name], DO [facility] 09/21/2017, 5:19 PM

## 2017-09-21 NOTE — Telephone Encounter (Signed)
Pt. Called requesting a referral to  ENT. Pt call back  # is   [phone]

## 2017-10-04 DIAGNOSIS — L570 Actinic keratosis: Secondary | ICD-10-CM | POA: Diagnosis not present

## 2017-10-04 DIAGNOSIS — L2089 Other atopic dermatitis: Secondary | ICD-10-CM | POA: Diagnosis not present

## 2017-10-04 DIAGNOSIS — L578 Other skin changes due to chronic exposure to nonionizing radiation: Secondary | ICD-10-CM | POA: Diagnosis not present

## 2017-10-05 DIAGNOSIS — H43813 Vitreous degeneration, bilateral: Secondary | ICD-10-CM | POA: Diagnosis not present

## 2017-10-05 DIAGNOSIS — H353114 Nonexudative age-related macular degeneration, right eye, advanced atrophic with subfoveal involvement: Secondary | ICD-10-CM | POA: Diagnosis not present

## 2017-10-05 DIAGNOSIS — H353222 Exudative age-related macular degeneration, left eye, with inactive choroidal neovascularization: Secondary | ICD-10-CM | POA: Diagnosis not present

## 2017-10-13 DIAGNOSIS — J358 Other chronic diseases of tonsils and adenoids: Secondary | ICD-10-CM | POA: Diagnosis not present

## 2017-10-13 DIAGNOSIS — K146 Glossodynia: Secondary | ICD-10-CM | POA: Diagnosis not present

## 2017-10-14 DIAGNOSIS — R432 Parageusia: Secondary | ICD-10-CM | POA: Diagnosis not present

## 2017-10-19 ENCOUNTER — Encounter: Payer: Self-pay | Admitting: Family Medicine

## 2017-10-19 ENCOUNTER — Telehealth: Payer: Self-pay | Admitting: Family Medicine

## 2017-10-19 DIAGNOSIS — E538 Deficiency of other specified B group vitamins: Secondary | ICD-10-CM

## 2017-10-19 LAB — VITAMIN B12
Folate: 12.3 (ref 3–?)
VITAMIN B1 (THIAMINE): 120.4 (ref 66.5–200)
Vitamin B6: 74.7 — AB (ref 5.3–46.7)

## 2017-10-19 LAB — IRON AND TIBC
IRON BIND. CAP.(TOTAL): 264 (ref 250–450)
IRON SATURATION: 57 — AB (ref 15–55)
IRON: 150 (ref 38–169)
UIBC: 114 (ref 111–343)

## 2017-10-19 NOTE — Telephone Encounter (Signed)
Received fax from Dr Meade Maw ENT with lab results including abnormal Vitamin B12, patient was referred for abnormal / loss of taste, and I have not reviewed any office note yet, just received lab result. It shows low B12 < 150, and otherwise normal iron and TIBC except slightly high iron sat only borderline elevted, otherwise elevated Folate, and normal B6 and B1.  As requested by ENT patient was advised to reach [place] for B12 injections.  I called patient to review this, he is unsure if this was exact cause of his taste abnormality at this time. He would like to proceed with Vitamin B12 injections.  I recommend Vitamin B12 1000 mcg IM injection weekly for 4 weeks then we will re-order Vitamin B12 lab, and if improved we can continue at 1000 mcg injection monthly OR may take oral for several months and re-check again.  I do not have any local pharmacy on file for patient.  Could you contact patient to schedule 4 weekly Nurse Visit B12 injection at earliest convenience (And also find out which local pharmacy he would like me to send B12 vials to, and notify him that he needs to pick it up and bring it to office for injection)?  [name], [facility] 10/19/2017, 12:45 PM

## 2017-10-20 ENCOUNTER — Ambulatory Visit (INDEPENDENT_AMBULATORY_CARE_PROVIDER_SITE_OTHER): Payer: Medicare Other

## 2017-10-20 DIAGNOSIS — D508 Other iron deficiency anemias: Secondary | ICD-10-CM

## 2017-10-20 MED ORDER — CYANOCOBALAMIN 1000 MCG/ML IJ SOLN
1000.0000 ug | Freq: Once | INTRAMUSCULAR | Status: AC
  Administered 2017-10-20: 1000 ug via INTRAMUSCULAR

## 2017-10-20 MED ORDER — CYANOCOBALAMIN 1000 MCG/ML IJ SOLN: 1000.0000 ug | INTRAMUSCULAR | 0 refills | Status: DC

## 2017-10-20 NOTE — Addendum Note (Signed)
Addended by: Olin Hauser on: 10/20/2017 12:11 AM   Modules accepted: Orders

## 2017-10-20 NOTE — Telephone Encounter (Signed)
Ordered Vitamin B12 1,068mcg IM x 1 dose weekly x 4 doses (4 weeks) sent to [facility]. Patient to be notified to pick up and bring to our office for nurse visit [facility] B12 injection.  Order future lab non fasting 4-5 weeks for Vitamin B12 level.  [name], [facility] 10/20/2017, 12:08 AM

## 2017-10-20 NOTE — Telephone Encounter (Signed)
Pt's wife is informed to schedule nurse and lab appointment and Rx send to [facility].

## 2017-10-27 ENCOUNTER — Ambulatory Visit (INDEPENDENT_AMBULATORY_CARE_PROVIDER_SITE_OTHER): Payer: Medicare Other

## 2017-10-27 DIAGNOSIS — D519 Vitamin B12 deficiency anemia, unspecified: Secondary | ICD-10-CM

## 2017-10-27 MED ORDER — CYANOCOBALAMIN 1000 MCG/ML IJ SOLN
1000.0000 ug | Freq: Once | INTRAMUSCULAR | Status: AC
  Administered 2017-10-27: 1000 ug via INTRAMUSCULAR

## 2017-11-03 ENCOUNTER — Ambulatory Visit: Payer: Self-pay

## 2017-11-04 ENCOUNTER — Ambulatory Visit (INDEPENDENT_AMBULATORY_CARE_PROVIDER_SITE_OTHER): Payer: Medicare Other

## 2017-11-04 DIAGNOSIS — E538 Deficiency of other specified B group vitamins: Secondary | ICD-10-CM

## 2017-11-04 MED ORDER — CYANOCOBALAMIN 1000 MCG/ML IJ SOLN
1000.0000 ug | Freq: Once | INTRAMUSCULAR | Status: AC
  Administered 2017-11-04: 1000 ug via INTRAMUSCULAR

## 2017-11-11 ENCOUNTER — Ambulatory Visit (INDEPENDENT_AMBULATORY_CARE_PROVIDER_SITE_OTHER): Payer: Medicare Other

## 2017-11-11 DIAGNOSIS — E538 Deficiency of other specified B group vitamins: Secondary | ICD-10-CM

## 2017-11-11 MED ORDER — CYANOCOBALAMIN 1000 MCG/ML IJ SOLN
1000.0000 ug | Freq: Once | INTRAMUSCULAR | Status: AC
  Administered 2017-11-11: 1000 ug via INTRAMUSCULAR

## 2017-11-18 ENCOUNTER — Other Ambulatory Visit: Payer: Medicare Other

## 2017-11-18 DIAGNOSIS — Z125 Encounter for screening for malignant neoplasm of prostate: Secondary | ICD-10-CM

## 2017-11-18 DIAGNOSIS — E782 Mixed hyperlipidemia: Secondary | ICD-10-CM

## 2017-11-18 DIAGNOSIS — R7309 Other abnormal glucose: Secondary | ICD-10-CM

## 2017-11-18 DIAGNOSIS — I10 Essential (primary) hypertension: Secondary | ICD-10-CM

## 2017-11-18 DIAGNOSIS — E538 Deficiency of other specified B group vitamins: Secondary | ICD-10-CM

## 2017-11-18 DIAGNOSIS — D508 Other iron deficiency anemias: Secondary | ICD-10-CM

## 2017-11-18 DIAGNOSIS — I2510 Atherosclerotic heart disease of native coronary artery without angina pectoris: Secondary | ICD-10-CM | POA: Diagnosis not present

## 2017-11-19 LAB — HEMOGLOBIN A1C
EAG (MMOL/L): 6 (calc)
HEMOGLOBIN A1C: 5.4 %{Hb} (ref <5.7)
MEAN PLASMA GLUCOSE: 108 (calc)

## 2017-11-19 LAB — CBC WITH DIFFERENTIAL/PLATELET
BASOS PCT: 1 %
Basophils Absolute: 59 cells/uL (ref 0–200)
EOS PCT: 6.1 %
Eosinophils Absolute: 360 cells/uL (ref 15–500)
HCT: 41.9 % (ref 38.5–50.0)
HEMOGLOBIN: 14.6 g/dL (ref 13.2–17.1)
Lymphs Abs: 1434 cells/uL (ref 850–3900)
MCH: 31.2 pg (ref 27.0–33.0)
MCHC: 34.8 g/dL (ref 32.0–36.0)
MCV: 89.5 fL (ref 80.0–100.0)
MPV: 9.7 fL (ref 7.5–12.5)
Monocytes Relative: 8.3 %
NEUTROS ABS: 3558 {cells}/uL (ref 1500–7800)
Neutrophils Relative %: 60.3 %
Platelets: 182 10*3/uL (ref 140–400)
RBC: 4.68 10*6/uL (ref 4.20–5.80)
RDW: 12.2 % (ref 11.0–15.0)
Total Lymphocyte: 24.3 %
WBC mixed population: 490 cells/uL (ref 200–950)
WBC: 5.9 10*3/uL (ref 3.8–10.8)

## 2017-11-19 LAB — COMPLETE METABOLIC PANEL WITH GFR
AG RATIO: 1.4 (calc) (ref 1.0–2.5)
ALBUMIN MSPROF: 4.2 g/dL (ref 3.6–5.1)
ALKALINE PHOSPHATASE (APISO): 61 U/L (ref 40–115)
ALT: 29 U/L (ref 9–46)
AST: 25 U/L (ref 10–35)
BUN: 14 mg/dL (ref 7–25)
CALCIUM: 9 mg/dL (ref 8.6–10.3)
CO2: 28 mmol/L (ref 20–32)
Chloride: 106 mmol/L (ref 98–110)
Creat: 1.03 mg/dL (ref 0.70–1.18)
GFR, EST NON AFRICAN AMERICAN: 69 mL/min/{1.73_m2} (ref >=60)
GFR, Est African American: 80 mL/min/{1.73_m2} (ref >=60)
Globulin: 3 g/dL (calc) (ref 1.9–3.7)
Glucose, Bld: 126 mg/dL (ref 65–139)
POTASSIUM: 4.2 mmol/L (ref 3.5–5.3)
SODIUM: 141 mmol/L (ref 135–146)
Total Bilirubin: 0.7 mg/dL (ref 0.2–1.2)
Total Protein: 7.2 g/dL (ref 6.1–8.1)

## 2017-11-19 LAB — LIPID PANEL
CHOL/HDL RATIO: 2.2 (calc) (ref <5.0)
CHOLESTEROL: 85 mg/dL (ref <200)
HDL: 39 mg/dL — AB (ref >40)
LDL CHOLESTEROL (CALC): 28 mg/dL
Non-HDL Cholesterol (Calc): 46 mg/dL (calc) (ref <130)
Triglycerides: 93 mg/dL (ref <150)

## 2017-11-19 LAB — PSA, TOTAL WITH REFLEX TO PSA, FREE: PSA, TOTAL: 0.5 ng/mL (ref <=4.0)

## 2017-11-19 LAB — VITAMIN B12: VITAMIN B 12: 561 pg/mL (ref 200–1100)

## 2017-11-21 ENCOUNTER — Other Ambulatory Visit: Payer: Self-pay | Admitting: Family Medicine

## 2017-11-21 DIAGNOSIS — D508 Other iron deficiency anemias: Secondary | ICD-10-CM

## 2017-11-21 DIAGNOSIS — R7309 Other abnormal glucose: Secondary | ICD-10-CM

## 2017-11-21 DIAGNOSIS — E538 Deficiency of other specified B group vitamins: Secondary | ICD-10-CM

## 2017-12-28 DIAGNOSIS — H401131 Primary open-angle glaucoma, bilateral, mild stage: Secondary | ICD-10-CM | POA: Diagnosis not present

## 2017-12-28 DIAGNOSIS — H52223 Regular astigmatism, bilateral: Secondary | ICD-10-CM | POA: Diagnosis not present

## 2018-01-04 DIAGNOSIS — H353222 Exudative age-related macular degeneration, left eye, with inactive choroidal neovascularization: Secondary | ICD-10-CM | POA: Diagnosis not present

## 2018-01-04 DIAGNOSIS — H353114 Nonexudative age-related macular degeneration, right eye, advanced atrophic with subfoveal involvement: Secondary | ICD-10-CM | POA: Diagnosis not present

## 2018-02-22 ENCOUNTER — Other Ambulatory Visit: Payer: Self-pay

## 2018-02-22 ENCOUNTER — Ambulatory Visit: Payer: Self-pay

## 2018-02-22 DIAGNOSIS — E538 Deficiency of other specified B group vitamins: Secondary | ICD-10-CM

## 2018-02-22 DIAGNOSIS — D508 Other iron deficiency anemias: Secondary | ICD-10-CM

## 2018-02-23 ENCOUNTER — Other Ambulatory Visit: Payer: Self-pay

## 2018-02-23 LAB — BASIC METABOLIC PANEL WITH GFR
BUN: 19 mg/dL (ref 7–25)
CALCIUM: 9.4 mg/dL (ref 8.6–10.3)
CHLORIDE: 104 mmol/L (ref 98–110)
CO2: 31 mmol/L (ref 20–32)
Creat: 1.07 mg/dL (ref 0.70–1.18)
GFR, Est African American: 77 mL/min/{1.73_m2} (ref >=60)
GFR, Est Non African American: 66 mL/min/{1.73_m2} (ref >=60)
GLUCOSE: 111 mg/dL — AB (ref 65–99)
Potassium: 4.6 mmol/L (ref 3.5–5.3)
Sodium: 140 mmol/L (ref 135–146)

## 2018-02-23 LAB — CBC WITH DIFFERENTIAL/PLATELET
BASOS PCT: 1 %
Basophils Absolute: 57 cells/uL (ref 0–200)
EOS PCT: 4.2 %
Eosinophils Absolute: 239 cells/uL (ref 15–500)
HEMATOCRIT: 39.9 % (ref 38.5–50.0)
HEMOGLOBIN: 14 g/dL (ref 13.2–17.1)
LYMPHS ABS: 1226 {cells}/uL (ref 850–3900)
MCH: 31.7 pg (ref 27.0–33.0)
MCHC: 35.1 g/dL (ref 32.0–36.0)
MCV: 90.3 fL (ref 80.0–100.0)
MPV: 9.7 fL (ref 7.5–12.5)
Monocytes Relative: 11 %
NEUTROS ABS: 3551 {cells}/uL (ref 1500–7800)
Neutrophils Relative %: 62.3 %
Platelets: 212 10*3/uL (ref 140–400)
RBC: 4.42 10*6/uL (ref 4.20–5.80)
RDW: 12.4 % (ref 11.0–15.0)
Total Lymphocyte: 21.5 %
WBC mixed population: 627 cells/uL (ref 200–950)
WBC: 5.7 10*3/uL (ref 3.8–10.8)

## 2018-02-23 LAB — VITAMIN B12: Vitamin B-12: 1042 pg/mL (ref 200–1100)

## 2018-02-28 ENCOUNTER — Other Ambulatory Visit: Payer: Self-pay | Admitting: Family Medicine

## 2018-02-28 ENCOUNTER — Ambulatory Visit (INDEPENDENT_AMBULATORY_CARE_PROVIDER_SITE_OTHER): Payer: Medicare Other | Admitting: Family Medicine

## 2018-02-28 ENCOUNTER — Encounter: Payer: Self-pay | Admitting: Family Medicine

## 2018-02-28 VITALS — BP 132/64 | HR 49 | Temp 98.1°F | Resp 16 | Ht 70.0 in | Wt 195.0 lb

## 2018-02-28 DIAGNOSIS — R7309 Other abnormal glucose: Secondary | ICD-10-CM | POA: Diagnosis not present

## 2018-02-28 DIAGNOSIS — I2510 Atherosclerotic heart disease of native coronary artery without angina pectoris: Secondary | ICD-10-CM | POA: Diagnosis not present

## 2018-02-28 DIAGNOSIS — E538 Deficiency of other specified B group vitamins: Secondary | ICD-10-CM | POA: Diagnosis not present

## 2018-02-28 DIAGNOSIS — E782 Mixed hyperlipidemia: Secondary | ICD-10-CM

## 2018-02-28 DIAGNOSIS — R432 Parageusia: Secondary | ICD-10-CM | POA: Diagnosis not present

## 2018-02-28 DIAGNOSIS — I10 Essential (primary) hypertension: Secondary | ICD-10-CM | POA: Diagnosis not present

## 2018-02-28 DIAGNOSIS — Z0000 Encounter for general adult medical examination without abnormal findings: Secondary | ICD-10-CM | POA: Diagnosis not present

## 2018-02-28 MED ORDER — VITAMIN B12 3000 MCG SL SUBL: 3000.0000 ug | SUBLINGUAL_TABLET | Freq: Every day | SUBLINGUAL | Status: DC

## 2018-02-28 NOTE — Assessment & Plan Note (Signed)
Stable, without angina. S/p PCI stent in 12/2016 after STEMI Followed by [facility], on med management now, DAPT, BB, ARB, Statin
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
## 2018-02-28 NOTE — Assessment & Plan Note (Addendum)
Resolved Vitamin B12 deficiency S/p B12 injection series Now remains on Oral B12 therapy, he is taking high dose than advised at 3,000 mcg BID Has some improved taste but still has reduced taste to sweets  Today advised him to reduce this to 3,077mcg daily - in future he should reduce further to 1 to 2,000 mcg daily for maintenance  Re-check B12 lab level in 6 months, no office visit will notify him of result, then next return to office 02/2019

## 2018-02-28 NOTE — Assessment & Plan Note (Signed)
Controlled cholesterol on statin and lifestyle Last lipid panel 11/2017 Known CAD  Plan: 1. Continue current meds - Atorvastatin 40mg  daily 2. Continue DAPT Plavix + ASA 81mg  for secondary ASCVD risk reduction 3. Encourage improved lifestyle - low carb/cholesterol, reduce portion size, continue improving regular exercise F/u w/ cardiology, next visit with me for yearly 02/2019 - lipids

## 2018-02-28 NOTE — Assessment & Plan Note (Signed)
Previously improved A1c to 5.4 on last check in interval Encourage continue current improved lifestyle to control sugar Follow-up in 6 months for lab only A1c, then will return to office in 02/2019 for yearly

## 2018-02-28 NOTE — Assessment & Plan Note (Signed)
Improved now only reduce taste of sweets Otherwise normal Has seen [facility] No new concerns, seems improved on B12 resolution

## 2018-02-28 NOTE — Patient Instructions (Addendum)
Thank you for coming to the office today.  Keep up the good work overall!  BP improved, was mildly elevated initially, keep checking BP at home. If consistently abnormal >140/90 notify office to review further.  VItamin B12 is now normal. Reduce dose down to 3,000 once daily and then in future we can reduce further to 1,000 once daily  Likely do not need office visit next time in 6 months we can just check lab  DUE for FASTING BLOOD WORK (no food or drink after midnight before the lab appointment, only water or coffee without cream/sugar on the morning of)  SCHEDULE "Lab Only" visit in the morning at the clinic for lab draw in 6 MONTHS - A1c, Vitamin B12, Chemistry  For Lab Results, once available within 2-3 days of blood draw, you can can log in to MyChart online to view your results and a brief explanation. Also, we can discuss results at next follow-up visit.  Please schedule a Follow-up Appointment to: Return in about 6 months (around 08/30/2018) for Lab Only.  If you have any other questions or concerns, please feel free to call the office or send a message through [facility] [place]. You may also schedule an earlier appointment if necessary.  Additionally, you may be receiving a survey about your experience at our office within a few days to 1 week by e-mail or mail. We value your feedback.  [name], DO [place]

## 2018-02-28 NOTE — Assessment & Plan Note (Signed)
Well-controlled HTN. Mild elevated BP in office initially but repeat is improved. - Home BP readings normal  Complication with CAD    Plan:  1. Continue current BP regimen Losartan 25mg  daily, Metoprolol 25mg  BID (half of 50mg ) 2. Encourage improved lifestyle - low sodium diet, regular exercise 3. Continue monitor BP outside office, bring readings to next visit, if persistently >140/90 or new symptoms notify office sooner 4. Follow-up 6 months with Cardiology - can see me yearly now

## 2018-03-28 DIAGNOSIS — H52223 Regular astigmatism, bilateral: Secondary | ICD-10-CM | POA: Diagnosis not present

## 2018-03-28 DIAGNOSIS — H401131 Primary open-angle glaucoma, bilateral, mild stage: Secondary | ICD-10-CM | POA: Diagnosis not present

## 2018-03-28 DIAGNOSIS — Z961 Presence of intraocular lens: Secondary | ICD-10-CM | POA: Diagnosis not present

## 2018-03-28 DIAGNOSIS — H353113 Nonexudative age-related macular degeneration, right eye, advanced atrophic without subfoveal involvement: Secondary | ICD-10-CM | POA: Diagnosis not present

## 2018-04-12 DIAGNOSIS — H353222 Exudative age-related macular degeneration, left eye, with inactive choroidal neovascularization: Secondary | ICD-10-CM | POA: Diagnosis not present

## 2018-04-12 DIAGNOSIS — H43813 Vitreous degeneration, bilateral: Secondary | ICD-10-CM | POA: Diagnosis not present

## 2018-04-12 DIAGNOSIS — H353114 Nonexudative age-related macular degeneration, right eye, advanced atrophic with subfoveal involvement: Secondary | ICD-10-CM | POA: Diagnosis not present

## 2018-06-27 DIAGNOSIS — Z961 Presence of intraocular lens: Secondary | ICD-10-CM | POA: Diagnosis not present

## 2018-06-27 DIAGNOSIS — H353113 Nonexudative age-related macular degeneration, right eye, advanced atrophic without subfoveal involvement: Secondary | ICD-10-CM | POA: Diagnosis not present

## 2018-06-27 DIAGNOSIS — H401131 Primary open-angle glaucoma, bilateral, mild stage: Secondary | ICD-10-CM | POA: Diagnosis not present

## 2018-06-27 DIAGNOSIS — H353222 Exudative age-related macular degeneration, left eye, with inactive choroidal neovascularization: Secondary | ICD-10-CM | POA: Diagnosis not present

## 2018-07-12 DIAGNOSIS — R011 Cardiac murmur, unspecified: Secondary | ICD-10-CM | POA: Diagnosis not present

## 2018-07-12 DIAGNOSIS — I228 Subsequent ST elevation (STEMI) myocardial infarction of other sites: Secondary | ICD-10-CM | POA: Diagnosis not present

## 2018-07-12 DIAGNOSIS — I25118 Atherosclerotic heart disease of native coronary artery with other forms of angina pectoris: Secondary | ICD-10-CM | POA: Diagnosis not present

## 2018-07-12 DIAGNOSIS — I208 Other forms of angina pectoris: Secondary | ICD-10-CM | POA: Diagnosis not present

## 2018-07-12 DIAGNOSIS — E785 Hyperlipidemia, unspecified: Secondary | ICD-10-CM | POA: Diagnosis not present

## 2018-07-13 DIAGNOSIS — H353222 Exudative age-related macular degeneration, left eye, with inactive choroidal neovascularization: Secondary | ICD-10-CM | POA: Diagnosis not present

## 2018-07-13 DIAGNOSIS — H353114 Nonexudative age-related macular degeneration, right eye, advanced atrophic with subfoveal involvement: Secondary | ICD-10-CM | POA: Diagnosis not present

## 2018-08-30 ENCOUNTER — Other Ambulatory Visit: Payer: Medicare Other

## 2018-08-30 ENCOUNTER — Ambulatory Visit (INDEPENDENT_AMBULATORY_CARE_PROVIDER_SITE_OTHER): Payer: Medicare Other

## 2018-08-30 DIAGNOSIS — Z23 Encounter for immunization: Secondary | ICD-10-CM | POA: Diagnosis not present

## 2018-08-30 DIAGNOSIS — I10 Essential (primary) hypertension: Secondary | ICD-10-CM

## 2018-08-30 DIAGNOSIS — R7309 Other abnormal glucose: Secondary | ICD-10-CM | POA: Diagnosis not present

## 2018-08-30 DIAGNOSIS — E538 Deficiency of other specified B group vitamins: Secondary | ICD-10-CM

## 2018-08-31 ENCOUNTER — Encounter: Payer: Self-pay | Admitting: Family Medicine

## 2018-08-31 ENCOUNTER — Ambulatory Visit (INDEPENDENT_AMBULATORY_CARE_PROVIDER_SITE_OTHER): Payer: Medicare Other | Admitting: Family Medicine

## 2018-08-31 VITALS — BP 136/52 | HR 60 | Temp 98.2°F | Resp 16 | Ht 70.0 in | Wt 197.6 lb

## 2018-08-31 DIAGNOSIS — M7552 Bursitis of left shoulder: Secondary | ICD-10-CM | POA: Diagnosis not present

## 2018-08-31 LAB — BASIC METABOLIC PANEL WITH GFR
BUN: 22 mg/dL (ref 7–25)
CO2: 27 mmol/L (ref 20–32)
Calcium: 9.4 mg/dL (ref 8.6–10.3)
Chloride: 105 mmol/L (ref 98–110)
Creat: 0.98 mg/dL (ref 0.70–1.18)
GFR, Est African American: 85 mL/min/{1.73_m2} (ref >=60)
GFR, Est Non African American: 73 mL/min/{1.73_m2} (ref >=60)
GLUCOSE: 107 mg/dL — AB (ref 65–99)
Potassium: 4.3 mmol/L (ref 3.5–5.3)
SODIUM: 140 mmol/L (ref 135–146)

## 2018-08-31 LAB — HEMOGLOBIN A1C
HEMOGLOBIN A1C: 5.6 %{Hb} (ref <5.7)
Mean Plasma Glucose: 114 (calc)
eAG (mmol/L): 6.3 (calc)

## 2018-08-31 LAB — VITAMIN B12

## 2018-08-31 MED ORDER — BACLOFEN 10 MG PO TABS: 5.0000 mg | ORAL_TABLET | Freq: Three times a day (TID) | ORAL | 0 refills | Status: DC | PRN

## 2018-08-31 MED ORDER — NAPROXEN 500 MG PO TABS: 500.0000 mg | ORAL_TABLET | Freq: Two times a day (BID) | ORAL | 0 refills | Status: DC

## 2018-08-31 NOTE — Progress Notes (Signed)
Subjective:    Patient ID: Jason Craig, male    DOB: [birth year], 77 y.o.   MRN: [id number]  Jason Craig is a 77 y.o. male presenting on 08/31/2018 for Shoulder Pain (left couple of weeks)   HPI   Left Shoulder Pain - He is Left handed Reports chronic problem without known prior injury, now gradually worsening problem, describes deeper sharp pain and some aching within shoulder deeper worse with raising arm and twisting arm, he still is very activity with work and outdoor activities, he was working at gym and doing some shoulder raising/pressing worse only pushing up and activity above his head. 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, previously documented, no dedicated shoulder imaging, he had prior injury thrown off a horse had fractured ribs Denies joint swelling, redness, numbness tingling, weakness, fall injury trauma  Health Maintenance: UTD Flu vaccine  Depression screen [facility] 2/9 08/31/2018 02/28/2018 09/10/2017  Decreased Interest 0 0 0  Down, Depressed, Hopeless 0 0 0  PHQ - 2 Score 0 0 0  Altered sleeping - - -  Tired, decreased energy - - -  Change in appetite - - -  Feeling bad or failure about yourself  - - -  Trouble concentrating - - -  Moving slowly or fidgety/restless - - -  Suicidal thoughts - - -  PHQ-9 Score - - -    Social History   Tobacco Use  . Smoking status: Former Smoker    Types: Cigarettes    Last attempt to quit: 11/25/1983    Years since quitting: 34.7  . Smokeless tobacco: Former Network engineer Use Topics  . Alcohol use: No  . Drug use: No    Review of Systems Per HPI unless specifically indicated above     Objective:    BP (!) 136/52   Pulse 60   Temp 98.2 F (36.8 C) (Oral)   Resp 16   Ht 5\' 10"  (1.778 m)   Wt 197 lb 9.6 oz (89.6 kg)   BMI 28.35 kg/m   Wt Readings from Last 3 Encounters:    08/31/18 197 lb 9.6 oz (89.6 kg)  02/28/18 195 lb (88.5 kg)  09/10/17 197 lb (89.4 kg)    Physical Exam  Constitutional: He is oriented to person, place, and time. He appears well-developed and well-nourished. No distress.  Well-appearing, comfortable, cooperative  HENT:  Head: Normocephalic and atraumatic.  Mouth/Throat: Oropharynx is clear and moist.  Eyes: Conjunctivae are normal. Right eye exhibits no discharge. Left eye exhibits no discharge.  Cardiovascular: Normal rate.  Pulmonary/Chest: Effort normal.  Musculoskeletal: He exhibits no edema.  LEFT Shoulder Inspection: Normal appearance bilateral symmetrical Palpation: MIld -tender to palpation over anterior ROM: REDUCED ROM forward flex above shoulder level, int rotation w/ pain Special Testing: Rotator cuff testing negative for weakness with supraspinatus full can and empty can test, Hawkin's AC impingement POSITIVE for pain L  Strength: Normal strength 5/5 flex/ext, ext rot / int rot, grip, rotator cuff str testing. Neurovascular: Distally intact pulses, sensation to light touch   Neurological: He is alert and oriented to person, place, and time.  Skin: Skin is warm and dry. No rash noted. He is not diaphoretic. No erythema.  Psychiatric: He has a normal mood and affect. His behavior is normal.  Well groomed, good eye contact, normal speech and thoughts  Nursing note and vitals reviewed.  Results for orders placed or performed in visit on 08/30/18  Vitamin B12  Result Value Ref Range   Vitamin B-12 >2,000 (H) 200 - 1,100 pg/mL  BASIC METABOLIC PANEL WITH GFR  Result Value Ref Range   Glucose, Bld 107 (H) 65 - 99 mg/dL   BUN 22 7 - 25 mg/dL   Creat 0.98 0.70 - 1.18 mg/dL   GFR, Est Non African American 73 > OR = 60 mL/min/1.62m2   GFR, Est African American 85 > OR = 60 mL/min/1.70m2   BUN/Creatinine Ratio NOT APPLICABLE 6 - 22 (calc)   Sodium 140 135 - 146 mmol/L   Potassium 4.3 3.5 - 5.3 mmol/L   Chloride 105 98 -  110 mmol/L   CO2 27 20 - 32 mmol/L   Calcium 9.4 8.6 - 10.3 mg/dL  Hemoglobin A1c  Result Value Ref Range   Hgb A1c MFr Bld 5.6 <5.7 % of total Hgb   Mean Plasma Glucose 114 (calc)   eAG (mmol/L) 6.3 (calc)      Assessment & Plan:   Problem List Items Addressed This Visit    None    Visit Diagnoses    Acute bursitis of left shoulder    -  Primary   Relevant Medications   naproxen (NAPROSYN) 500 MG tablet   baclofen (LIORESAL) 10 MG tablet      Consistent with acute L-shoulder bursitis with some reduced active ROM but without significant evidence of muscle tear (no weakness). Known repetitive overhead/strenuous activity as likely etiology  No clear etiology of injury. 77 yr old patient with likely underlying arthritis other areas - No imaging on chart of shoulder  Plan: 1. Start rx Naproxen 500mg  twice daily (with food) for 2 weeks, then as needed - Also baclofen given PRN  Precaution given 2. May take Tylenol Ex Str 1-2 q 6 hr PRN 3. Relative rest but keep shoulder mobile, demonstrated ROM exercises, avoid heavy lifting 4. May try heating pad PRN 5. Follow-up 4-6 weeks if not improved for re-evaluation, consider referral to Physical Therapy, X-rays, and or subacromial steroid injection   Meds ordered this encounter  Medications  . naproxen (NAPROSYN) 500 MG tablet    Sig: Take 1 tablet (500 mg total) by mouth 2 (two) times daily with a meal. For 2-4 weeks then as needed    Dispense:  60 tablet    Refill:  0  . baclofen (LIORESAL) 10 MG tablet    Sig: Take 0.5-1 tablets (5-10 mg total) by mouth 3 (three) times daily as needed for muscle spasms.    Dispense:  30 each    Refill:  0      Follow up plan: Return in about 4 weeks (around 09/28/2018) for left shoulder bursitis.   Jason Craig, [facility] 08/31/2018, 7:03 PM

## 2018-08-31 NOTE — Patient Instructions (Addendum)
Thank you for coming to the office today.  Most likely you have bursitis of your shoulder. This is inflammation of the shoulder joint caused most often by arthritis or wear and tear. Often it can flare up to cause bursitis due to repetitive activities or other triggers. It may take time to heal, possibly 2 to 6 weeks, and it is important to avoid over use of shoulder especially above head motions that can re-aggravate the problem.  Recommend trial of Anti-inflammatory with Naproxen (Naprosyn) 500mg  tabs - take one with food and plenty of water TWICE daily every day (breakfast and dinner), for next 2 to 4 weeks, then you may take only as needed  - DO NOT TAKE any ibuprofen, aleve, motrin while you are taking this medicine  Recommend to start taking Tylenol Extra Strength 500mg  tabs - take 1 to 2 tabs per dose (max 1000mg ) every 6-8 hours for pain (take regularly, don't skip a dose for next 7 days), max 24 hour daily dose is 6 tablets or 3000mg . In the future you can repeat the same everyday Tylenol course for 1-2 weeks at a time.   Start taking Baclofen (Lioresal) 10mg  (muscle relaxant) - start with half (cut) to one whole pill at night as needed for next 1-3 nights (may make you drowsy, caution with driving) see how it affects you, then if tolerated increase to one pill 2 to 3 times a day or (every 8 hours as needed)   Future we can check X-ray  If not improving as expected or keeps waking you up - call our office 1-2 weeks - we can add a muscle relaxant to help sleep.  Also we can refer you to Physical Therapy if just need to improve on range of motion.  Lastly if not improved by about 4-6 weeks - or just need treatment sooner - call and return for a Steroid Injection in shoulder.  Please schedule a Follow-up Appointment to: Return in about 4 weeks (around 09/28/2018) for left shoulder bursitis.  If you have any other questions or concerns, please feel free to call the office or send a message  through [place]. You may also schedule an earlier appointment if necessary.  Additionally, you may be receiving a survey about your experience at our office within a few days to 1 week by e-mail or mail. We value your feedback.  [name], DO [facility], [facility]  Range of Motion Shoulder Exercises  Strang with your good arm against a counter or table for support [facility] forward with a wide stance (make sure your body is comfortable) - Your painful shoulder should hang down and feel "heavy" - Gently move your painful arm in small circles "clockwise" for several turns - Switch to "counterclockwise" for several turns - Early on keep circles narrow and move slowly - Later in rehab, move in larger circles and faster movement   Wall Crawl - Stand close (about 1-2 ft away) to a wall, facing it directly - Reach out with your arm of painful shoulder and place fingers (not palm) on wall - You should make contact with wall at your waist level - Slowly walk your fingers up the wall. Stay in contact with wall entire time, do not remove fingers - Keep walking fingers up wall until you reach shoulder level - You may feel tightening or mild discomfort, once you reach a height that causes pain or if you are already above your shoulder height  then stop. Repeat from starting position. - Early on stand closer to wall, move fingers slowly, and stay at or below shoulder level - Later in rehab, stand farther away from wall (fingertips), move fingers quicker, go above shoulder level

## 2018-09-01 ENCOUNTER — Other Ambulatory Visit: Payer: Self-pay | Admitting: Family Medicine

## 2018-09-01 DIAGNOSIS — E538 Deficiency of other specified B group vitamins: Secondary | ICD-10-CM

## 2018-09-01 MED ORDER — VITAMIN B-12 1000 MCG PO TABS: 1000.0000 ug | ORAL_TABLET | Freq: Every day | ORAL | 0 refills | Status: AC

## 2018-10-03 DIAGNOSIS — Z961 Presence of intraocular lens: Secondary | ICD-10-CM | POA: Diagnosis not present

## 2018-10-03 DIAGNOSIS — H353113 Nonexudative age-related macular degeneration, right eye, advanced atrophic without subfoveal involvement: Secondary | ICD-10-CM | POA: Diagnosis not present

## 2018-10-03 DIAGNOSIS — H401131 Primary open-angle glaucoma, bilateral, mild stage: Secondary | ICD-10-CM | POA: Diagnosis not present

## 2018-10-03 DIAGNOSIS — H353222 Exudative age-related macular degeneration, left eye, with inactive choroidal neovascularization: Secondary | ICD-10-CM | POA: Diagnosis not present

## 2018-10-19 DIAGNOSIS — H353114 Nonexudative age-related macular degeneration, right eye, advanced atrophic with subfoveal involvement: Secondary | ICD-10-CM | POA: Diagnosis not present

## 2018-10-19 DIAGNOSIS — H43813 Vitreous degeneration, bilateral: Secondary | ICD-10-CM | POA: Diagnosis not present

## 2018-10-19 DIAGNOSIS — H3540 Unspecified peripheral retinal degeneration: Secondary | ICD-10-CM | POA: Diagnosis not present

## 2018-10-19 DIAGNOSIS — H353222 Exudative age-related macular degeneration, left eye, with inactive choroidal neovascularization: Secondary | ICD-10-CM | POA: Diagnosis not present

## 2018-11-23 DIAGNOSIS — L400 Psoriasis vulgaris: Secondary | ICD-10-CM | POA: Diagnosis not present

## 2018-11-23 DIAGNOSIS — L570 Actinic keratosis: Secondary | ICD-10-CM | POA: Diagnosis not present

## 2019-01-02 DIAGNOSIS — H401131 Primary open-angle glaucoma, bilateral, mild stage: Secondary | ICD-10-CM | POA: Diagnosis not present

## 2019-01-02 DIAGNOSIS — H353222 Exudative age-related macular degeneration, left eye, with inactive choroidal neovascularization: Secondary | ICD-10-CM | POA: Diagnosis not present

## 2019-01-02 DIAGNOSIS — Z961 Presence of intraocular lens: Secondary | ICD-10-CM | POA: Diagnosis not present

## 2019-01-02 DIAGNOSIS — H353113 Nonexudative age-related macular degeneration, right eye, advanced atrophic without subfoveal involvement: Secondary | ICD-10-CM | POA: Diagnosis not present

## 2019-01-18 DIAGNOSIS — H353222 Exudative age-related macular degeneration, left eye, with inactive choroidal neovascularization: Secondary | ICD-10-CM | POA: Diagnosis not present

## 2019-01-23 DIAGNOSIS — L400 Psoriasis vulgaris: Secondary | ICD-10-CM | POA: Diagnosis not present

## 2019-01-23 DIAGNOSIS — Z79899 Other long term (current) drug therapy: Secondary | ICD-10-CM | POA: Diagnosis not present

## 2019-02-20 ENCOUNTER — Other Ambulatory Visit: Payer: Self-pay

## 2019-02-27 ENCOUNTER — Encounter: Payer: Self-pay | Admitting: Family Medicine

## 2019-04-10 DIAGNOSIS — H43813 Vitreous degeneration, bilateral: Secondary | ICD-10-CM | POA: Diagnosis not present

## 2019-04-10 DIAGNOSIS — H353222 Exudative age-related macular degeneration, left eye, with inactive choroidal neovascularization: Secondary | ICD-10-CM | POA: Diagnosis not present

## 2019-04-10 DIAGNOSIS — H353113 Nonexudative age-related macular degeneration, right eye, advanced atrophic without subfoveal involvement: Secondary | ICD-10-CM | POA: Diagnosis not present

## 2019-04-10 DIAGNOSIS — Z961 Presence of intraocular lens: Secondary | ICD-10-CM | POA: Diagnosis not present

## 2019-04-10 DIAGNOSIS — H401131 Primary open-angle glaucoma, bilateral, mild stage: Secondary | ICD-10-CM | POA: Diagnosis not present

## 2019-04-19 DIAGNOSIS — H43813 Vitreous degeneration, bilateral: Secondary | ICD-10-CM | POA: Diagnosis not present

## 2019-04-19 DIAGNOSIS — H353222 Exudative age-related macular degeneration, left eye, with inactive choroidal neovascularization: Secondary | ICD-10-CM | POA: Diagnosis not present

## 2019-04-19 DIAGNOSIS — H353114 Nonexudative age-related macular degeneration, right eye, advanced atrophic with subfoveal involvement: Secondary | ICD-10-CM | POA: Diagnosis not present

## 2019-06-26 ENCOUNTER — Other Ambulatory Visit: Payer: Self-pay | Admitting: Family Medicine

## 2019-06-26 DIAGNOSIS — R7309 Other abnormal glucose: Secondary | ICD-10-CM

## 2019-06-26 DIAGNOSIS — R351 Nocturia: Secondary | ICD-10-CM

## 2019-06-26 DIAGNOSIS — Z0000 Encounter for general adult medical examination without abnormal findings: Secondary | ICD-10-CM

## 2019-06-26 DIAGNOSIS — I10 Essential (primary) hypertension: Secondary | ICD-10-CM

## 2019-06-26 DIAGNOSIS — E785 Hyperlipidemia, unspecified: Secondary | ICD-10-CM

## 2019-06-26 DIAGNOSIS — E538 Deficiency of other specified B group vitamins: Secondary | ICD-10-CM

## 2019-06-27 ENCOUNTER — Other Ambulatory Visit: Payer: Self-pay

## 2019-06-27 ENCOUNTER — Other Ambulatory Visit: Payer: Medicare Other

## 2019-06-27 DIAGNOSIS — E785 Hyperlipidemia, unspecified: Secondary | ICD-10-CM | POA: Diagnosis not present

## 2019-06-27 DIAGNOSIS — R7309 Other abnormal glucose: Secondary | ICD-10-CM | POA: Diagnosis not present

## 2019-06-27 DIAGNOSIS — Z0000 Encounter for general adult medical examination without abnormal findings: Secondary | ICD-10-CM | POA: Diagnosis not present

## 2019-06-27 DIAGNOSIS — E538 Deficiency of other specified B group vitamins: Secondary | ICD-10-CM | POA: Diagnosis not present

## 2019-06-28 LAB — COMPREHENSIVE METABOLIC PANEL
AG Ratio: 1.5 (calc) (ref 1.0–2.5)
ALT: 24 U/L (ref 9–46)
AST: 21 U/L (ref 10–35)
Albumin: 4.4 g/dL (ref 3.6–5.1)
Alkaline phosphatase (APISO): 69 U/L (ref 35–144)
BUN/Creatinine Ratio: 22 (calc) (ref 6–22)
BUN: 27 mg/dL — ABNORMAL HIGH (ref 7–25)
CO2: 27 mmol/L (ref 20–32)
Calcium: 9.7 mg/dL (ref 8.6–10.3)
Chloride: 105 mmol/L (ref 98–110)
Creat: 1.24 mg/dL — ABNORMAL HIGH (ref 0.70–1.11)
Globulin: 3 g/dL (calc) (ref 1.9–3.7)
Glucose, Bld: 120 mg/dL — ABNORMAL HIGH (ref 65–99)
Potassium: 4.9 mmol/L (ref 3.5–5.3)
Sodium: 140 mmol/L (ref 135–146)
Total Bilirubin: 0.7 mg/dL (ref 0.2–1.2)
Total Protein: 7.4 g/dL (ref 6.1–8.1)

## 2019-06-28 LAB — PSA: PSA: 1.7 ng/mL (ref <=4.0)

## 2019-06-28 LAB — LIPID PANEL
Cholesterol: 81 mg/dL (ref <200)
HDL: 45 mg/dL (ref >=40)
LDL Cholesterol (Calc): 22 mg/dL (calc)
Non-HDL Cholesterol (Calc): 36 mg/dL (calc) (ref <130)
Total CHOL/HDL Ratio: 1.8 (calc) (ref <5.0)
Triglycerides: 65 mg/dL (ref <150)

## 2019-06-28 LAB — CBC WITH DIFFERENTIAL/PLATELET
Absolute Monocytes: 700 cells/uL (ref 200–950)
Basophils Absolute: 68 cells/uL (ref 0–200)
Basophils Relative: 1 %
Eosinophils Absolute: 299 cells/uL (ref 15–500)
Eosinophils Relative: 4.4 %
HCT: 42.6 % (ref 38.5–50.0)
Hemoglobin: 14.7 g/dL (ref 13.2–17.1)
Lymphs Abs: 1761 cells/uL (ref 850–3900)
MCH: 31.9 pg (ref 27.0–33.0)
MCHC: 34.5 g/dL (ref 32.0–36.0)
MCV: 92.4 fL (ref 80.0–100.0)
MPV: 9.6 fL (ref 7.5–12.5)
Monocytes Relative: 10.3 %
Neutro Abs: 3971 cells/uL (ref 1500–7800)
Neutrophils Relative %: 58.4 %
Platelets: 234 10*3/uL (ref 140–400)
RBC: 4.61 10*6/uL (ref 4.20–5.80)
RDW: 12.4 % (ref 11.0–15.0)
Total Lymphocyte: 25.9 %
WBC: 6.8 10*3/uL (ref 3.8–10.8)

## 2019-06-28 LAB — HEMOGLOBIN A1C
Hgb A1c MFr Bld: 5.9 % of total Hgb — ABNORMAL HIGH (ref <5.7)
Mean Plasma Glucose: 123 (calc)
eAG (mmol/L): 6.8 (calc)

## 2019-06-28 LAB — TSH: TSH: 3.8 mIU/L (ref 0.40–4.50)

## 2019-06-28 LAB — VITAMIN B12: Vitamin B-12: 736 pg/mL (ref 200–1100)

## 2019-07-04 ENCOUNTER — Ambulatory Visit (INDEPENDENT_AMBULATORY_CARE_PROVIDER_SITE_OTHER): Payer: Medicare Other | Admitting: Family Medicine

## 2019-07-04 ENCOUNTER — Other Ambulatory Visit: Payer: Self-pay

## 2019-07-04 ENCOUNTER — Encounter: Payer: Self-pay | Admitting: Family Medicine

## 2019-07-04 VITALS — BP 127/71 | HR 75 | Temp 98.6°F | Resp 16 | Ht 70.0 in | Wt 194.0 lb

## 2019-07-04 DIAGNOSIS — E782 Mixed hyperlipidemia: Secondary | ICD-10-CM | POA: Diagnosis not present

## 2019-07-04 DIAGNOSIS — E538 Deficiency of other specified B group vitamins: Secondary | ICD-10-CM

## 2019-07-04 DIAGNOSIS — M25561 Pain in right knee: Secondary | ICD-10-CM | POA: Diagnosis not present

## 2019-07-04 DIAGNOSIS — R7309 Other abnormal glucose: Secondary | ICD-10-CM

## 2019-07-04 DIAGNOSIS — N183 Chronic kidney disease, stage 3 (moderate): Secondary | ICD-10-CM

## 2019-07-04 DIAGNOSIS — I129 Hypertensive chronic kidney disease with stage 1 through stage 4 chronic kidney disease, or unspecified chronic kidney disease: Secondary | ICD-10-CM | POA: Diagnosis not present

## 2019-07-04 DIAGNOSIS — Z0000 Encounter for general adult medical examination without abnormal findings: Secondary | ICD-10-CM
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
## 2019-07-04 NOTE — Assessment & Plan Note (Signed)
Subacute on chronic R medial knee pain with minimal localized swelling without known injury or trauma recently but old injury >50 years ago. 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 Concern for meniscus vs ligament injury given locking symptoms/]twisting injury mechanism,  - given age and DJD likely chronic degenerative tear if it is meniscus - Able to bear weight, but occasional locking - No prior history of knee surgery, arthroscopy - Inadequate conservative therapy   Plan: 1. Avoid NSAID due to CKD 2. Start Tylenol 500-1000mg  per dose TID PRN breakthrough 3. RICE therapy (rest, ice, compression, elevation) for swelling, activity modification 4. Knee x-rays ordered [facility], will get within 1-2 weeks 5. Handwritten order [facility] - will follow progress 6. Follow-up 4-6 weeks or longer, if still worsening, consider steroid injection and referral to Ortho for further eval, may need MRI if concern remains for meniscus / ligament injury

## 2019-07-04 NOTE — Patient Instructions (Addendum)
Thank you for coming to the office today.  Try to improve hydration more water, less diet pepsi  Reduce sweets for improving sugar  ------------------------  1. Chemistry - Moderate elevated creatinine up to 1.24, similar to 2 years ago, possible AKI. Otherwise normal chemistry, slightly elevated fasting glucose.   2. Hemoglobin A1c (Diabetes screening) - 5.9, elevated from 5.4 to 5.6, now in range of Pre-Diabetes (>5.7 to 6.4)   3. PSA Prostate Cancer Screening - 1.7, negative.   4. TSH Thyroid Function Tests - Normal.   5. Vitamin B12 - 736 normal   6. Cholesterol - Normal cholesterol results. On atorvastatin 40mg    7. CBC Blood Counts - Normal, no anemia, other abnormality   -----------------------------------------------------------------  You most likely have a ligament or meniscus injury of knee, can be a wear and tear injury  X-ray today ordered at  [facility] Address: [address], [place], [place] [address] Phone: [phone]  Use knee sleeve for compression  - It is safe to take Tylenol Ext Str 500mg  tabs - take 1 to 2 (max dose 1000mg ) every 6 hours as needed for breakthrough pain, max 24 hour daily dose is 6 to 8 tablets or 4000mg    Use RICE therapy: - R - Rest / relative rest with activity modification avoid overuse and frequent bending or pressure on bent knee - I - Ice packs (make sure you use a towel or sock / something to protect skin) - C - Compression with ACE wrap or immobilizer apply pressure and reduce swelling allowing more support - E - Elevation - if significant swelling, lift leg above heart level (toes above your nose) to help reduce swelling, most helpful at night after day of being on your feet--  [facility]  ---------------------------  If not improving or worsening  [facility] (formerly [facility]) Address: [address], [place], [place]  [address] Hours:  9AM-5PM Phone: [phone]  Orthopedics should also have an Urgent Care Walk In Availability for after hours.     DUE for FASTING BLOOD WORK (no food or drink after midnight before the lab appointment, only water or coffee without cream/sugar on the morning of)  SCHEDULE "Lab Only" visit in the morning at the clinic for lab draw in 6 MONTHS   - Make sure Lab Only appointment is at about 1 week before your next appointment, so that results will be available  For Lab Results, once available within 2-3 days of blood draw, you can can log in to MyChart online to view your results and a brief explanation. Also, we can discuss results at next follow-up visit.   Please schedule a Follow-up Appointment to: Return in about 6 months (around 01/04/2020) for 6 month follow-up Elevated sugar, HTN kidney lab result.  If you have any other questions or concerns, please feel free to call the office or send a message through [place]. You may also schedule an earlier appointment if necessary.  Additionally, you may be receiving a survey about your experience at our office within a few days to 1 week by e-mail or mail. We value your feedback.  [name], DO [place]

## 2019-07-04 NOTE — Assessment & Plan Note (Signed)
Controlled cholesterol on statin and lifestyle Last lipid panel 06/2019 Known CAD  Plan: 1. Continue current meds - Atorvastatin 40mg  daily 2. Continue DAPT Plavix + ASA 81mg  for secondary ASCVD risk reduction 3. Encourage improved lifestyle - low carb/cholesterol, reduce portion size, continue improving regular exercise

## 2019-07-04 NOTE — Assessment & Plan Note (Addendum)
Well-controlled HTN - Home BP readings normal  Complication with CAD, CKD-III elevated Cr    Plan:  1. Continue current BP regimen Losartan 25mg  daily, Metoprolol 25mg  BID (half of 50mg ) 2. Encourage improved lifestyle - low sodium diet, regular exercise - Improve hydration, reduce diet soda and inc water, avoid NSAIDs 3. Continue monitor BP outside office, bring readings to next visit, if persistently >140/90 or new symptoms notify office sooner 4. Follow-up 6 months

## 2019-07-04 NOTE — Assessment & Plan Note (Signed)
Elevated A1c up to 5.9, previously normal 5.6 Encourage continue current improved lifestyle to control sugar - limit sweets Follow-up in 6 months for lab A1c

## 2019-07-04 NOTE — Assessment & Plan Note (Signed)
Stable, resolved Vitamin B12 deficiency S/p B12 injection series Continue oral Vitamin B12 1,000 daily

## 2019-07-10 DIAGNOSIS — H353222 Exudative age-related macular degeneration, left eye, with inactive choroidal neovascularization: Secondary | ICD-10-CM | POA: Diagnosis not present

## 2019-07-10 DIAGNOSIS — H43813 Vitreous degeneration, bilateral: Secondary | ICD-10-CM | POA: Diagnosis not present

## 2019-07-10 DIAGNOSIS — H401131 Primary open-angle glaucoma, bilateral, mild stage: Secondary | ICD-10-CM | POA: Diagnosis not present

## 2019-07-10 DIAGNOSIS — H353113 Nonexudative age-related macular degeneration, right eye, advanced atrophic without subfoveal involvement: Secondary | ICD-10-CM | POA: Diagnosis not present

## 2019-07-10 DIAGNOSIS — Z961 Presence of intraocular lens: Secondary | ICD-10-CM | POA: Diagnosis not present

## 2019-07-20 DIAGNOSIS — M25561 Pain in right knee: Secondary | ICD-10-CM | POA: Diagnosis not present

## 2019-07-24 DIAGNOSIS — R011 Cardiac murmur, unspecified: Secondary | ICD-10-CM | POA: Diagnosis not present

## 2019-07-24 DIAGNOSIS — M25561 Pain in right knee: Secondary | ICD-10-CM | POA: Diagnosis not present

## 2019-07-24 DIAGNOSIS — I228 Subsequent ST elevation (STEMI) myocardial infarction of other sites: Secondary | ICD-10-CM | POA: Diagnosis not present

## 2019-07-24 DIAGNOSIS — I208 Other forms of angina pectoris: Secondary | ICD-10-CM | POA: Diagnosis not present

## 2019-07-24 DIAGNOSIS — I25118 Atherosclerotic heart disease of native coronary artery with other forms of angina pectoris: Secondary | ICD-10-CM | POA: Diagnosis not present

## 2019-07-24 DIAGNOSIS — E785 Hyperlipidemia, unspecified: Secondary | ICD-10-CM | POA: Diagnosis not present

## 2019-07-25 DIAGNOSIS — H353222 Exudative age-related macular degeneration, left eye, with inactive choroidal neovascularization: Secondary | ICD-10-CM | POA: Diagnosis not present

## 2019-07-25 DIAGNOSIS — H353114 Nonexudative age-related macular degeneration, right eye, advanced atrophic with subfoveal involvement: Secondary | ICD-10-CM | POA: Diagnosis not present

## 2019-07-31 DIAGNOSIS — M25561 Pain in right knee: Secondary | ICD-10-CM | POA: Diagnosis not present

## 2019-08-03 DIAGNOSIS — M25561 Pain in right knee: Secondary | ICD-10-CM | POA: Diagnosis not present

## 2019-08-14 DIAGNOSIS — M25561 Pain in right knee: Secondary | ICD-10-CM | POA: Diagnosis not present

## 2019-08-17 DIAGNOSIS — M25561 Pain in right knee: Secondary | ICD-10-CM | POA: Diagnosis not present

## 2019-08-23 DIAGNOSIS — L400 Psoriasis vulgaris: Secondary | ICD-10-CM | POA: Diagnosis not present

## 2019-10-02 ENCOUNTER — Ambulatory Visit (INDEPENDENT_AMBULATORY_CARE_PROVIDER_SITE_OTHER): Payer: Medicare Other

## 2019-10-02 ENCOUNTER — Other Ambulatory Visit: Payer: Self-pay

## 2019-10-02 DIAGNOSIS — Z23 Encounter for immunization: Secondary | ICD-10-CM

## 2019-10-13 DIAGNOSIS — Z961 Presence of intraocular lens: Secondary | ICD-10-CM | POA: Diagnosis not present

## 2019-10-13 DIAGNOSIS — H353113 Nonexudative age-related macular degeneration, right eye, advanced atrophic without subfoveal involvement: Secondary | ICD-10-CM | POA: Diagnosis not present

## 2019-10-13 DIAGNOSIS — H401131 Primary open-angle glaucoma, bilateral, mild stage: Secondary | ICD-10-CM | POA: Diagnosis not present

## 2019-10-13 DIAGNOSIS — H353222 Exudative age-related macular degeneration, left eye, with inactive choroidal neovascularization: Secondary | ICD-10-CM | POA: Diagnosis not present

## 2019-10-13 DIAGNOSIS — T1501XA Foreign body in cornea, right eye, initial encounter: Secondary | ICD-10-CM | POA: Diagnosis not present

## 2019-10-13 DIAGNOSIS — H43813 Vitreous degeneration, bilateral: Secondary | ICD-10-CM | POA: Diagnosis not present

## 2019-10-24 DIAGNOSIS — H353221 Exudative age-related macular degeneration, left eye, with active choroidal neovascularization: Secondary | ICD-10-CM | POA: Diagnosis not present

## 2019-10-24 DIAGNOSIS — H353114 Nonexudative age-related macular degeneration, right eye, advanced atrophic with subfoveal involvement: Secondary | ICD-10-CM | POA: Diagnosis not present

## 2019-10-24 DIAGNOSIS — H43813 Vitreous degeneration, bilateral: Secondary | ICD-10-CM | POA: Diagnosis not present

## 2019-12-28 ENCOUNTER — Other Ambulatory Visit: Payer: Medicare Other

## 2019-12-29 ENCOUNTER — Telehealth: Payer: Self-pay

## 2019-12-29 DIAGNOSIS — R7309 Other abnormal glucose: Secondary | ICD-10-CM

## 2019-12-29 DIAGNOSIS — I129 Hypertensive chronic kidney disease with stage 1 through stage 4 chronic kidney disease, or unspecified chronic kidney disease: Secondary | ICD-10-CM

## 2019-12-29 NOTE — Telephone Encounter (Signed)
Signed lab orders.  [name], DO [facility] 12/29/2019, 1:43 PM

## 2020-01-01 ENCOUNTER — Other Ambulatory Visit: Payer: Medicare Other

## 2020-01-01 DIAGNOSIS — R7309 Other abnormal glucose: Secondary | ICD-10-CM | POA: Diagnosis not present

## 2020-01-01 DIAGNOSIS — I129 Hypertensive chronic kidney disease with stage 1 through stage 4 chronic kidney disease, or unspecified chronic kidney disease: Secondary | ICD-10-CM | POA: Diagnosis not present

## 2020-01-01 DIAGNOSIS — N183 Chronic kidney disease, stage 3 unspecified: Secondary | ICD-10-CM | POA: Diagnosis not present

## 2020-01-02 ENCOUNTER — Ambulatory Visit: Payer: Medicare Other

## 2020-01-02 LAB — BASIC METABOLIC PANEL WITH GFR
BUN: 15 mg/dL (ref 7–25)
CO2: 29 mmol/L (ref 20–32)
Calcium: 9.3 mg/dL (ref 8.6–10.3)
Chloride: 102 mmol/L (ref 98–110)
Creat: 0.94 mg/dL (ref 0.70–1.11)
GFR, Est African American: 88 mL/min/{1.73_m2} (ref >=60)
GFR, Est Non African American: 76 mL/min/{1.73_m2} (ref >=60)
Glucose, Bld: 136 mg/dL — ABNORMAL HIGH (ref 65–99)
Potassium: 4.4 mmol/L (ref 3.5–5.3)
Sodium: 137 mmol/L (ref 135–146)

## 2020-01-02 LAB — HEMOGLOBIN A1C
Hgb A1c MFr Bld: 5.8 % of total Hgb — ABNORMAL HIGH (ref <5.7)
Mean Plasma Glucose: 120 (calc)
eAG (mmol/L): 6.6 (calc)

## 2020-01-04 ENCOUNTER — Encounter: Payer: Self-pay | Admitting: Family Medicine

## 2020-01-04 ENCOUNTER — Other Ambulatory Visit: Payer: Self-pay

## 2020-01-04 ENCOUNTER — Other Ambulatory Visit: Payer: Self-pay | Admitting: Family Medicine

## 2020-01-04 ENCOUNTER — Ambulatory Visit (INDEPENDENT_AMBULATORY_CARE_PROVIDER_SITE_OTHER): Payer: Medicare Other | Admitting: Family Medicine

## 2020-01-04 VITALS — BP 128/70 | HR 72 | Temp 97.8°F | Resp 16 | Ht 70.0 in | Wt 202.0 lb

## 2020-01-04 DIAGNOSIS — R7309 Other abnormal glucose: Secondary | ICD-10-CM

## 2020-01-04 DIAGNOSIS — N182 Chronic kidney disease, stage 2 (mild): Secondary | ICD-10-CM | POA: Diagnosis not present

## 2020-01-04 DIAGNOSIS — E538 Deficiency of other specified B group vitamins: Secondary | ICD-10-CM

## 2020-01-04 DIAGNOSIS — Z0000 Encounter for general adult medical examination without abnormal findings: Secondary | ICD-10-CM

## 2020-01-04 DIAGNOSIS — E782 Mixed hyperlipidemia: Secondary | ICD-10-CM

## 2020-01-04 DIAGNOSIS — I129 Hypertensive chronic kidney disease with stage 1 through stage 4 chronic kidney disease, or unspecified chronic kidney disease: Secondary | ICD-10-CM

## 2020-01-04 DIAGNOSIS — D508 Other iron deficiency anemias: Secondary | ICD-10-CM

## 2020-01-04 DIAGNOSIS — R351 Nocturia: Secondary | ICD-10-CM

## 2020-01-04 NOTE — Assessment & Plan Note (Addendum)
Well-controlled HTN Significantly improved Creatinine with inc hydration now back to normal baseline - Home BP readings normal  Complication with CAD, CKD-II    Plan:  1. Continue current BP regimen Losartan 25mg  daily, Metoprolol 25mg  BID (half of 50mg ) 2. Encourage improved lifestyle - low sodium diet, regular exercise - Improve hydration, reduce diet soda and inc water, avoid NSAIDs 3. Continue monitor BP outside office, bring readings to next visit, if persistently >140/90 or new symptoms notify office sooner

## 2020-01-04 NOTE — Assessment & Plan Note (Signed)
Stable, resolved Vitamin B12 deficiency Continue oral Vitamin B12 1,000 daily 

## 2020-01-04 NOTE — Progress Notes (Signed)
Subjective:    Patient ID: Jason Craig, male    DOB: [birth year], 79 y.o.   MRN: [id number]  Jason Craig is a 79 y.o. male presenting on 01/04/2020 for Hypertension   HPI   CHRONIC HYPERTENSION / CKD II Improved on last lab result Cr down from 1.24 down to 0.94 now with improved water intake and less soda. Followed by Dr Clayborn Bigness [facility]) prior history of STEMI w/ stent in 12/2016 Reportshe checks BP at home readings controlled Current Meds -Metoprolol25mg  BID (half of 50mg  pill from Cardiology), Losartan 25mg  Reports good compliance, took meds today. Tolerating well, w/o complaints. Lifestyle: - Exercise:Active outdoors Denies CP, dyspnea, HA, edema, dizziness / lightheadedness  Vitamin B12 Deficiency - Prior history of low vitamin B12. He has completed injection therapy at our office in early Winter 2019. - Last lab normal range 06/2019, 700 - On vitamin b12 1,000 oral daily - He has no concerns today  Pre-Diabetes Last visit up to 5.9, now significantly improved to A1c 5.8, with improved lifestyle diet, reduced sugars. He is still working. Limited by knee still. He has not exercised as much. He plans to have knee looked at by ortho in future after [place] Maintenance: UTD Flu vaccine  Depression screen [facility] 2/9 01/04/2020 07/04/2019 08/31/2018  Decreased Interest 0 0 0  Down, Depressed, Hopeless 0 0 0  PHQ - 2 Score 0 0 0  Altered sleeping - - -  Tired, decreased energy - - -  Change in appetite - - -  Feeling bad or failure about yourself  - - -  Trouble concentrating - - -  Moving slowly or fidgety/restless - - -  Suicidal thoughts - - -  PHQ-9 Score - - -    Social History   Tobacco Use  . Smoking status: Former Smoker    Types: Cigarettes    Quit date: 11/25/1983    Years since quitting: 36.1  . Smokeless tobacco: Former Network engineer Use Topics  . Alcohol use: No  . Drug use: No    Review of Systems Per HPI unless specifically  indicated above     Objective:    BP 128/70 (BP Location: Left Arm, Cuff Size: Normal)   Pulse 72   Temp 97.8 F (36.6 C) (Oral)   Resp 16   Ht 5\' 10"  (1.778 m)   Wt 202 lb (91.6 kg)   BMI 28.98 kg/m   Wt Readings from Last 3 Encounters:  01/04/20 202 lb (91.6 kg)  07/04/19 194 lb (88 kg)  08/31/18 197 lb 9.6 oz (89.6 kg)    Physical Exam Vitals and nursing note reviewed.  Constitutional:      General: He is not in acute distress.    Appearance: He is well-developed. He is not diaphoretic.     Comments: Well-appearing, comfortable, cooperative  HENT:     Head: Normocephalic and atraumatic.  Eyes:     General:        Right eye: No discharge.        Left eye: No discharge.     Conjunctiva/sclera: Conjunctivae normal.  Neck:     Thyroid: No thyromegaly.  Cardiovascular:     Rate and Rhythm: Normal rate and regular rhythm.     Heart sounds: Normal heart sounds. No murmur.  Pulmonary:     Effort: Pulmonary effort is normal. No respiratory distress.     Breath sounds: Normal breath sounds. No wheezing or rales.  Musculoskeletal:        General: Normal range of motion.     Cervical back: Normal range of motion and neck supple.  Lymphadenopathy:     Cervical: No cervical adenopathy.  Skin:    General: Skin is warm and dry.     Findings: No erythema or rash.  Neurological:     Mental Status: He is alert and oriented to person, place, and time.  Psychiatric:        Behavior: Behavior normal.     Comments: Well groomed, good eye contact, normal speech and thoughts    Results for orders placed or performed in visit on 01/01/20  Hemoglobin A1c  Result Value Ref Range   Hgb A1c MFr Bld 5.8 (H) <5.7 % of total Hgb   Mean Plasma Glucose 120 (calc)   eAG (mmol/L) 6.6 (calc)  BASIC METABOLIC PANEL WITH GFR  Result Value Ref Range   Glucose, Bld 136 (H) 65 - 99 mg/dL   BUN 15 7 - 25 mg/dL   Creat 0.94 0.70 - 1.11 mg/dL   GFR, Est Non African American 76 > OR = 60  mL/min/1.18m2   GFR, Est African American 88 > OR = 60 mL/min/1.1m2   BUN/Creatinine Ratio NOT APPLICABLE 6 - 22 (calc)   Sodium 137 135 - 146 mmol/L   Potassium 4.4 3.5 - 5.3 mmol/L   Chloride 102 98 - 110 mmol/L   CO2 29 20 - 32 mmol/L   Calcium 9.3 8.6 - 10.3 mg/dL      Assessment & Plan:   Problem List Items Addressed This Visit    Vitamin B12 deficiency    Stable, resolved Vitamin B12 deficiency Continue oral Vitamin B12 1,000 daily      Elevated hemoglobin A1c    Improved A1c to 5.8, stable mild PreDM Encourage continue current improved lifestyle to control sugar - limit sweets      Benign hypertension with CKD (chronic kidney disease), stage II - Primary    Well-controlled HTN Significantly improved Creatinine with inc hydration now back to normal baseline - Home BP readings normal  Complication with CAD, CKD-II    Plan:  1. Continue current BP regimen Losartan 25mg  daily, Metoprolol 25mg  BID (half of 50mg ) 2. Encourage improved lifestyle - low sodium diet, regular exercise - Improve hydration, reduce diet soda and inc water, avoid NSAIDs 3. Continue monitor BP outside office, bring readings to next visit, if persistently >140/90 or new symptoms notify office sooner         No orders of the defined types were placed in this encounter.     Follow up plan: Return in about 6 months (around 07/03/2020) for Annual Physical.  Future labs ordered for 06/27/20 add B12, no TSH   [name], DO [facility] 01/04/2020, 8:34 AM

## 2020-01-04 NOTE — Assessment & Plan Note (Signed)
Improved A1c to 5.8, stable mild PreDM Encourage continue current improved lifestyle to control sugar - limit sweets

## 2020-01-04 NOTE — Patient Instructions (Addendum)
Thank you for coming to the office today.  Keep up the good work Continue to hydrate well Limit sugars in diet A1c is improving, only mild Pre-Diabetes or Borderline sugar  Keep track of BP.  Kidney function is back to normal  DUE for FASTING BLOOD WORK (no food or drink after midnight before the lab appointment, only water or coffee without cream/sugar on the morning of)  SCHEDULE "Lab Only" visit in the morning at the clinic for lab draw in 6 MONTHS   - Make sure Lab Only appointment is at about 1 week before your next appointment, so that results will be available  For Lab Results, once available within 2-3 days of blood draw, you can can log in to MyChart online to view your results and a brief explanation. Also, we can discuss results at next follow-up visit.   Please schedule a Follow-up Appointment to: Return in about 6 months (around 07/03/2020) for Annual Physical.  If you have any other questions or concerns, please feel free to call the office or send a message through [place]. You may also schedule an earlier appointment if necessary.  Additionally, you may be receiving a survey about your experience at our office within a few days to 1 week by e-mail or mail. We value your feedback.  [name], DO [place]

## 2020-01-11 DIAGNOSIS — H353113 Nonexudative age-related macular degeneration, right eye, advanced atrophic without subfoveal involvement: Secondary | ICD-10-CM | POA: Diagnosis not present

## 2020-01-11 DIAGNOSIS — H401131 Primary open-angle glaucoma, bilateral, mild stage: Secondary | ICD-10-CM | POA: Diagnosis not present

## 2020-01-11 DIAGNOSIS — Z961 Presence of intraocular lens: Secondary | ICD-10-CM | POA: Diagnosis not present

## 2020-01-11 DIAGNOSIS — H353222 Exudative age-related macular degeneration, left eye, with inactive choroidal neovascularization: Secondary | ICD-10-CM | POA: Diagnosis not present

## 2020-01-11 DIAGNOSIS — H43813 Vitreous degeneration, bilateral: Secondary | ICD-10-CM | POA: Diagnosis not present

## 2020-04-30 DIAGNOSIS — H43813 Vitreous degeneration, bilateral: Secondary | ICD-10-CM | POA: Diagnosis not present

## 2020-04-30 DIAGNOSIS — H3540 Unspecified peripheral retinal degeneration: Secondary | ICD-10-CM | POA: Diagnosis not present

## 2020-04-30 DIAGNOSIS — H353222 Exudative age-related macular degeneration, left eye, with inactive choroidal neovascularization: Secondary | ICD-10-CM | POA: Diagnosis not present

## 2020-04-30 DIAGNOSIS — H353114 Nonexudative age-related macular degeneration, right eye, advanced atrophic with subfoveal involvement: Secondary | ICD-10-CM | POA: Diagnosis not present

## 2020-05-20 DIAGNOSIS — H353222 Exudative age-related macular degeneration, left eye, with inactive choroidal neovascularization: Secondary | ICD-10-CM | POA: Diagnosis not present

## 2020-05-20 DIAGNOSIS — H02052 Trichiasis without entropian right lower eyelid: Secondary | ICD-10-CM | POA: Diagnosis not present

## 2020-05-20 DIAGNOSIS — H401131 Primary open-angle glaucoma, bilateral, mild stage: Secondary | ICD-10-CM | POA: Diagnosis not present

## 2020-05-20 DIAGNOSIS — H353113 Nonexudative age-related macular degeneration, right eye, advanced atrophic without subfoveal involvement: Secondary | ICD-10-CM | POA: Diagnosis not present

## 2020-05-20 DIAGNOSIS — H02051 Trichiasis without entropian right upper eyelid: Secondary | ICD-10-CM | POA: Diagnosis not present

## 2020-05-20 DIAGNOSIS — H43813 Vitreous degeneration, bilateral: Secondary | ICD-10-CM | POA: Diagnosis not present

## 2020-05-20 DIAGNOSIS — Z961 Presence of intraocular lens: Secondary | ICD-10-CM | POA: Diagnosis not present

## 2020-06-27 ENCOUNTER — Other Ambulatory Visit: Payer: Medicare Other

## 2020-06-27 ENCOUNTER — Other Ambulatory Visit: Payer: Self-pay

## 2020-06-27 DIAGNOSIS — I129 Hypertensive chronic kidney disease with stage 1 through stage 4 chronic kidney disease, or unspecified chronic kidney disease: Secondary | ICD-10-CM

## 2020-06-27 DIAGNOSIS — E538 Deficiency of other specified B group vitamins: Secondary | ICD-10-CM

## 2020-06-27 DIAGNOSIS — E782 Mixed hyperlipidemia: Secondary | ICD-10-CM | POA: Diagnosis not present

## 2020-06-27 DIAGNOSIS — R7309 Other abnormal glucose: Secondary | ICD-10-CM | POA: Diagnosis not present

## 2020-06-27 DIAGNOSIS — D508 Other iron deficiency anemias: Secondary | ICD-10-CM

## 2020-06-27 DIAGNOSIS — Z0000 Encounter for general adult medical examination without abnormal findings: Secondary | ICD-10-CM

## 2020-06-27 DIAGNOSIS — R351 Nocturia: Secondary | ICD-10-CM

## 2020-06-28 LAB — COMPLETE METABOLIC PANEL WITH GFR
AG Ratio: 1.4 (calc) (ref 1.0–2.5)
ALT: 30 U/L (ref 9–46)
AST: 25 U/L (ref 10–35)
Albumin: 4.2 g/dL (ref 3.6–5.1)
Alkaline phosphatase (APISO): 78 U/L (ref 35–144)
BUN: 21 mg/dL (ref 7–25)
CO2: 28 mmol/L (ref 20–32)
Calcium: 9.3 mg/dL (ref 8.6–10.3)
Chloride: 105 mmol/L (ref 98–110)
Creat: 1.06 mg/dL (ref 0.70–1.11)
GFR, Est African American: 76 mL/min/{1.73_m2} (ref >=60)
GFR, Est Non African American: 65 mL/min/{1.73_m2} (ref >=60)
Globulin: 2.9 g/dL (calc) (ref 1.9–3.7)
Glucose, Bld: 124 mg/dL — ABNORMAL HIGH (ref 65–99)
Potassium: 5 mmol/L (ref 3.5–5.3)
Sodium: 139 mmol/L (ref 135–146)
Total Bilirubin: 0.7 mg/dL (ref 0.2–1.2)
Total Protein: 7.1 g/dL (ref 6.1–8.1)

## 2020-06-28 LAB — CBC WITH DIFFERENTIAL/PLATELET
Absolute Monocytes: 519 cells/uL (ref 200–950)
Basophils Absolute: 48 cells/uL (ref 0–200)
Basophils Relative: 0.9 %
Eosinophils Absolute: 451 cells/uL (ref 15–500)
Eosinophils Relative: 8.5 %
HCT: 40.5 % (ref 38.5–50.0)
Hemoglobin: 13.5 g/dL (ref 13.2–17.1)
Lymphs Abs: 1511 cells/uL (ref 850–3900)
MCH: 32.1 pg (ref 27.0–33.0)
MCHC: 33.3 g/dL (ref 32.0–36.0)
MCV: 96.4 fL (ref 80.0–100.0)
MPV: 9 fL (ref 7.5–12.5)
Monocytes Relative: 9.8 %
Neutro Abs: 2772 cells/uL (ref 1500–7800)
Neutrophils Relative %: 52.3 %
Platelets: 197 10*3/uL (ref 140–400)
RBC: 4.2 10*6/uL (ref 4.20–5.80)
RDW: 12.5 % (ref 11.0–15.0)
Total Lymphocyte: 28.5 %
WBC: 5.3 10*3/uL (ref 3.8–10.8)

## 2020-06-28 LAB — LIPID PANEL
Cholesterol: 96 mg/dL (ref <200)
HDL: 48 mg/dL (ref >=40)
LDL Cholesterol (Calc): 34 mg/dL (calc)
Non-HDL Cholesterol (Calc): 48 mg/dL (calc) (ref <130)
Total CHOL/HDL Ratio: 2 (calc) (ref <5.0)
Triglycerides: 67 mg/dL (ref <150)

## 2020-06-28 LAB — HEMOGLOBIN A1C
Hgb A1c MFr Bld: 5.7 % of total Hgb — ABNORMAL HIGH (ref <5.7)
Mean Plasma Glucose: 117 (calc)
eAG (mmol/L): 6.5 (calc)

## 2020-06-28 LAB — PSA: PSA: 1 ng/mL (ref <=4.0)

## 2020-06-28 LAB — VITAMIN B12: Vitamin B-12: 1687 pg/mL — ABNORMAL HIGH (ref 200–1100)

## 2020-07-04 ENCOUNTER — Encounter: Payer: Self-pay | Admitting: Family Medicine

## 2020-07-04 ENCOUNTER — Other Ambulatory Visit: Payer: Self-pay

## 2020-07-04 ENCOUNTER — Other Ambulatory Visit: Payer: Self-pay | Admitting: Family Medicine

## 2020-07-04 ENCOUNTER — Ambulatory Visit (INDEPENDENT_AMBULATORY_CARE_PROVIDER_SITE_OTHER): Payer: Medicare Other | Admitting: Family Medicine

## 2020-07-04 VITALS — BP 125/62 | HR 57 | Temp 97.1°F | Resp 16 | Ht 70.0 in | Wt 203.0 lb

## 2020-07-04 DIAGNOSIS — E782 Mixed hyperlipidemia: Secondary | ICD-10-CM

## 2020-07-04 DIAGNOSIS — Z0000 Encounter for general adult medical examination without abnormal findings: Secondary | ICD-10-CM

## 2020-07-04 DIAGNOSIS — D508 Other iron deficiency anemias: Secondary | ICD-10-CM

## 2020-07-04 DIAGNOSIS — R7309 Other abnormal glucose: Secondary | ICD-10-CM

## 2020-07-04 DIAGNOSIS — I129 Hypertensive chronic kidney disease with stage 1 through stage 4 chronic kidney disease, or unspecified chronic kidney disease: Secondary | ICD-10-CM

## 2020-07-04 DIAGNOSIS — N182 Chronic kidney disease, stage 2 (mild): Secondary | ICD-10-CM

## 2020-07-04 DIAGNOSIS — I2510 Atherosclerotic heart disease of native coronary artery without angina pectoris: Secondary | ICD-10-CM

## 2020-07-04 DIAGNOSIS — E538 Deficiency of other specified B group vitamins: Secondary | ICD-10-CM

## 2020-07-04 DIAGNOSIS — R351 Nocturia: Secondary | ICD-10-CM

## 2020-07-04 NOTE — Assessment & Plan Note (Signed)
Stable, resolved Vitamin B12 deficiency Continue oral Vitamin B12 1,000 daily 
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
## 2020-07-04 NOTE — Assessment & Plan Note (Signed)
Controlled cholesterol on statin and lifestyle Last lipid panel 06/2020 Known CAD  Plan: 1. Continue current meds - Atorvastatin 40mg  daily per cardiology 2. Continue ASA 81mg  for secondary ASCVD risk reduction OFF Plavix 3. Encourage improved lifestyle - low carb/cholesterol, reduce portion size, continue improving regular exercise

## 2020-07-04 NOTE — Assessment & Plan Note (Signed)
Well-controlled Pre-DM with A1c 5.7 now improved Concern with HTN, HLD CAD  Plan:  1. Not on any therapy currently  2. Encourage improved lifestyle - low carb, low sugar diet, reduce portion size, continue improving regular exercise  Yearly A1c

## 2020-07-04 NOTE — Assessment & Plan Note (Signed)
Resolved

## 2020-07-04 NOTE — Patient Instructions (Addendum)
Thank you for coming to the office today.  Keep up the great work!  Please schedule and return for a NURSE ONLY VISIT for VACCINE - Approximately around Sept / Oct - Need High Dose Flu Vaccine  DUE for FASTING BLOOD WORK (no food or drink after midnight before the lab appointment, only water or coffee without cream/sugar on the morning of)  SCHEDULE "Lab Only" visit in the morning at the clinic for lab draw in 1 YEAR  - Make sure Lab Only appointment is at about 1 week before your next appointment, so that results will be available  For Lab Results, once available within 2-3 days of blood draw, you can can log in to MyChart online to view your results and a brief explanation. Also, we can discuss results at next follow-up visit.   Please schedule a Follow-up Appointment to: Return in about 1 year (around 07/04/2021) for Annual Physical.  If you have any other questions or concerns, please feel free to call the office or send a message through [place]. You may also schedule an earlier appointment if necessary.  Additionally, you may be receiving a survey about your experience at our office within a few days to 1 week by e-mail or mail. We value your feedback.  [name], DO [place]

## 2020-07-04 NOTE — Assessment & Plan Note (Signed)
Stable, without angina. S/p PCI stent in 12/2016 after STEMI Followed by [facility], on med management ASA, BB, ARB, Statin OFF Plavix 

## 2020-07-04 NOTE — Assessment & Plan Note (Signed)
Well-controlled HTN Significantly improved Creatinine still, around 1.0 - Home BP readings normal  Complication with CAD, CKD-II    Plan:  1. Continue current BP regimen Losartan 25mg  daily, Metoprolol 25mg  BID (half of 50mg ) 2. Encourage improved lifestyle - low sodium diet, regular exercise - Improve hydration, reduce diet soda and inc water, avoid NSAIDs 3. Continue monitor BP outside office, bring readings to next visit, if persistently >140/90 or new symptoms notify office sooner

## 2020-07-29 DIAGNOSIS — I25118 Atherosclerotic heart disease of native coronary artery with other forms of angina pectoris: Secondary | ICD-10-CM | POA: Diagnosis not present

## 2020-07-29 DIAGNOSIS — R011 Cardiac murmur, unspecified: Secondary | ICD-10-CM | POA: Diagnosis not present

## 2020-07-29 DIAGNOSIS — I228 Subsequent ST elevation (STEMI) myocardial infarction of other sites: Secondary | ICD-10-CM | POA: Diagnosis not present

## 2020-07-29 DIAGNOSIS — I208 Other forms of angina pectoris: Secondary | ICD-10-CM | POA: Diagnosis not present

## 2020-07-29 DIAGNOSIS — E785 Hyperlipidemia, unspecified: Secondary | ICD-10-CM | POA: Diagnosis not present

## 2020-08-20 DIAGNOSIS — H353113 Nonexudative age-related macular degeneration, right eye, advanced atrophic without subfoveal involvement: Secondary | ICD-10-CM | POA: Diagnosis not present

## 2020-08-20 DIAGNOSIS — H401131 Primary open-angle glaucoma, bilateral, mild stage: Secondary | ICD-10-CM | POA: Diagnosis not present

## 2020-08-20 DIAGNOSIS — Z961 Presence of intraocular lens: Secondary | ICD-10-CM | POA: Diagnosis not present

## 2020-08-20 DIAGNOSIS — H353222 Exudative age-related macular degeneration, left eye, with inactive choroidal neovascularization: Secondary | ICD-10-CM | POA: Diagnosis not present

## 2020-08-20 DIAGNOSIS — H43813 Vitreous degeneration, bilateral: Secondary | ICD-10-CM | POA: Diagnosis not present

## 2020-08-20 DIAGNOSIS — H02051 Trichiasis without entropian right upper eyelid: Secondary | ICD-10-CM | POA: Diagnosis not present

## 2020-08-27 ENCOUNTER — Other Ambulatory Visit: Payer: Self-pay

## 2020-08-27 ENCOUNTER — Ambulatory Visit (INDEPENDENT_AMBULATORY_CARE_PROVIDER_SITE_OTHER): Payer: Medicare Other

## 2020-08-27 DIAGNOSIS — Z23 Encounter for immunization: Secondary | ICD-10-CM | POA: Diagnosis not present

## 2020-08-27 NOTE — Progress Notes (Signed)
flu

## 2020-10-23 DIAGNOSIS — H353114 Nonexudative age-related macular degeneration, right eye, advanced atrophic with subfoveal involvement: Secondary | ICD-10-CM | POA: Diagnosis not present

## 2020-10-23 DIAGNOSIS — H43813 Vitreous degeneration, bilateral: Secondary | ICD-10-CM | POA: Diagnosis not present

## 2020-10-23 DIAGNOSIS — H353222 Exudative age-related macular degeneration, left eye, with inactive choroidal neovascularization: Secondary | ICD-10-CM | POA: Diagnosis not present

## 2020-10-23 DIAGNOSIS — H3540 Unspecified peripheral retinal degeneration: Secondary | ICD-10-CM | POA: Diagnosis not present

## 2020-11-21 DIAGNOSIS — H02051 Trichiasis without entropian right upper eyelid: Secondary | ICD-10-CM | POA: Diagnosis not present

## 2020-11-21 DIAGNOSIS — H0288B Meibomian gland dysfunction left eye, upper and lower eyelids: Secondary | ICD-10-CM | POA: Diagnosis not present

## 2020-11-21 DIAGNOSIS — H02052 Trichiasis without entropian right lower eyelid: Secondary | ICD-10-CM | POA: Diagnosis not present

## 2020-11-21 DIAGNOSIS — H353113 Nonexudative age-related macular degeneration, right eye, advanced atrophic without subfoveal involvement: Secondary | ICD-10-CM | POA: Diagnosis not present

## 2020-11-21 DIAGNOSIS — H01021 Squamous blepharitis right upper eyelid: Secondary | ICD-10-CM | POA: Diagnosis not present

## 2020-11-21 DIAGNOSIS — H43813 Vitreous degeneration, bilateral: Secondary | ICD-10-CM | POA: Diagnosis not present

## 2020-11-21 DIAGNOSIS — H0288A Meibomian gland dysfunction right eye, upper and lower eyelids: Secondary | ICD-10-CM | POA: Diagnosis not present

## 2020-11-21 DIAGNOSIS — H01024 Squamous blepharitis left upper eyelid: Secondary | ICD-10-CM | POA: Diagnosis not present

## 2020-11-21 DIAGNOSIS — H353222 Exudative age-related macular degeneration, left eye, with inactive choroidal neovascularization: Secondary | ICD-10-CM | POA: Diagnosis not present

## 2020-11-21 DIAGNOSIS — Z961 Presence of intraocular lens: Secondary | ICD-10-CM | POA: Diagnosis not present

## 2020-12-06 DIAGNOSIS — H02051 Trichiasis without entropian right upper eyelid: Secondary | ICD-10-CM | POA: Diagnosis not present

## 2020-12-06 DIAGNOSIS — H353222 Exudative age-related macular degeneration, left eye, with inactive choroidal neovascularization: Secondary | ICD-10-CM | POA: Diagnosis not present

## 2020-12-06 DIAGNOSIS — H401131 Primary open-angle glaucoma, bilateral, mild stage: Secondary | ICD-10-CM | POA: Diagnosis not present

## 2020-12-06 DIAGNOSIS — H43813 Vitreous degeneration, bilateral: Secondary | ICD-10-CM | POA: Diagnosis not present

## 2020-12-06 DIAGNOSIS — H353113 Nonexudative age-related macular degeneration, right eye, advanced atrophic without subfoveal involvement: Secondary | ICD-10-CM | POA: Diagnosis not present

## 2020-12-06 DIAGNOSIS — Z961 Presence of intraocular lens: Secondary | ICD-10-CM | POA: Diagnosis not present

## 2021-01-14 DIAGNOSIS — Z872 Personal history of diseases of the skin and subcutaneous tissue: Secondary | ICD-10-CM | POA: Diagnosis not present

## 2021-01-14 DIAGNOSIS — L400 Psoriasis vulgaris: Secondary | ICD-10-CM | POA: Diagnosis not present

## 2021-01-14 DIAGNOSIS — Z79899 Other long term (current) drug therapy: Secondary | ICD-10-CM | POA: Diagnosis not present

## 2021-01-14 DIAGNOSIS — L570 Actinic keratosis: Secondary | ICD-10-CM | POA: Diagnosis not present

## 2021-01-14 DIAGNOSIS — D3610 Benign neoplasm of peripheral nerves and autonomic nervous system, unspecified: Secondary | ICD-10-CM | POA: Diagnosis not present

## 2021-01-14 DIAGNOSIS — L578 Other skin changes due to chronic exposure to nonionizing radiation: Secondary | ICD-10-CM | POA: Diagnosis not present

## 2021-01-22 DIAGNOSIS — H353114 Nonexudative age-related macular degeneration, right eye, advanced atrophic with subfoveal involvement: Secondary | ICD-10-CM | POA: Diagnosis not present

## 2021-01-22 DIAGNOSIS — H353222 Exudative age-related macular degeneration, left eye, with inactive choroidal neovascularization: Secondary | ICD-10-CM | POA: Diagnosis not present

## 2021-01-29 DIAGNOSIS — L400 Psoriasis vulgaris: Secondary | ICD-10-CM | POA: Diagnosis not present

## 2021-03-12 DIAGNOSIS — H43813 Vitreous degeneration, bilateral: Secondary | ICD-10-CM | POA: Diagnosis not present

## 2021-03-12 DIAGNOSIS — H353222 Exudative age-related macular degeneration, left eye, with inactive choroidal neovascularization: Secondary | ICD-10-CM | POA: Diagnosis not present

## 2021-03-12 DIAGNOSIS — H02051 Trichiasis without entropian right upper eyelid: Secondary | ICD-10-CM | POA: Diagnosis not present

## 2021-03-12 DIAGNOSIS — H353113 Nonexudative age-related macular degeneration, right eye, advanced atrophic without subfoveal involvement: Secondary | ICD-10-CM | POA: Diagnosis not present

## 2021-03-12 DIAGNOSIS — Z961 Presence of intraocular lens: Secondary | ICD-10-CM | POA: Diagnosis not present

## 2021-03-12 DIAGNOSIS — H401131 Primary open-angle glaucoma, bilateral, mild stage: Secondary | ICD-10-CM | POA: Diagnosis not present

## 2021-03-19 DIAGNOSIS — H16141 Punctate keratitis, right eye: Secondary | ICD-10-CM | POA: Diagnosis not present

## 2021-03-19 DIAGNOSIS — H353113 Nonexudative age-related macular degeneration, right eye, advanced atrophic without subfoveal involvement: Secondary | ICD-10-CM | POA: Diagnosis not present

## 2021-03-19 DIAGNOSIS — H353222 Exudative age-related macular degeneration, left eye, with inactive choroidal neovascularization: Secondary | ICD-10-CM | POA: Diagnosis not present

## 2021-03-19 DIAGNOSIS — H02059 Trichiasis without entropian unspecified eye, unspecified eyelid: Secondary | ICD-10-CM | POA: Diagnosis not present

## 2021-03-19 DIAGNOSIS — H401131 Primary open-angle glaucoma, bilateral, mild stage: Secondary | ICD-10-CM | POA: Diagnosis not present

## 2021-03-19 DIAGNOSIS — H10231 Serous conjunctivitis, except viral, right eye: Secondary | ICD-10-CM | POA: Diagnosis not present

## 2021-03-19 DIAGNOSIS — H43813 Vitreous degeneration, bilateral: Secondary | ICD-10-CM | POA: Diagnosis not present

## 2021-03-19 DIAGNOSIS — Z961 Presence of intraocular lens: Secondary | ICD-10-CM | POA: Diagnosis not present

## 2021-04-14 DIAGNOSIS — H16141 Punctate keratitis, right eye: Secondary | ICD-10-CM | POA: Diagnosis not present

## 2021-04-14 DIAGNOSIS — H43813 Vitreous degeneration, bilateral: Secondary | ICD-10-CM | POA: Diagnosis not present

## 2021-04-14 DIAGNOSIS — H10231 Serous conjunctivitis, except viral, right eye: Secondary | ICD-10-CM | POA: Diagnosis not present

## 2021-04-14 DIAGNOSIS — H02052 Trichiasis without entropian right lower eyelid: Secondary | ICD-10-CM | POA: Diagnosis not present

## 2021-04-14 DIAGNOSIS — H353113 Nonexudative age-related macular degeneration, right eye, advanced atrophic without subfoveal involvement: Secondary | ICD-10-CM | POA: Diagnosis not present

## 2021-04-14 DIAGNOSIS — H401131 Primary open-angle glaucoma, bilateral, mild stage: Secondary | ICD-10-CM | POA: Diagnosis not present

## 2021-04-14 DIAGNOSIS — H02051 Trichiasis without entropian right upper eyelid: Secondary | ICD-10-CM | POA: Diagnosis not present

## 2021-04-14 DIAGNOSIS — H353222 Exudative age-related macular degeneration, left eye, with inactive choroidal neovascularization: Secondary | ICD-10-CM | POA: Diagnosis not present

## 2021-04-14 DIAGNOSIS — Z961 Presence of intraocular lens: Secondary | ICD-10-CM | POA: Diagnosis not present

## 2021-04-21 DIAGNOSIS — H02052 Trichiasis without entropian right lower eyelid: Secondary | ICD-10-CM | POA: Diagnosis not present

## 2021-04-21 DIAGNOSIS — H43813 Vitreous degeneration, bilateral: Secondary | ICD-10-CM | POA: Diagnosis not present

## 2021-04-21 DIAGNOSIS — H353113 Nonexudative age-related macular degeneration, right eye, advanced atrophic without subfoveal involvement: Secondary | ICD-10-CM | POA: Diagnosis not present

## 2021-04-21 DIAGNOSIS — H10231 Serous conjunctivitis, except viral, right eye: Secondary | ICD-10-CM | POA: Diagnosis not present

## 2021-04-21 DIAGNOSIS — Z961 Presence of intraocular lens: Secondary | ICD-10-CM | POA: Diagnosis not present

## 2021-04-21 DIAGNOSIS — H401131 Primary open-angle glaucoma, bilateral, mild stage: Secondary | ICD-10-CM | POA: Diagnosis not present

## 2021-04-21 DIAGNOSIS — H02051 Trichiasis without entropian right upper eyelid: Secondary | ICD-10-CM | POA: Diagnosis not present

## 2021-04-21 DIAGNOSIS — H16141 Punctate keratitis, right eye: Secondary | ICD-10-CM | POA: Diagnosis not present

## 2021-04-21 DIAGNOSIS — H353222 Exudative age-related macular degeneration, left eye, with inactive choroidal neovascularization: Secondary | ICD-10-CM | POA: Diagnosis not present

## 2021-04-23 DIAGNOSIS — H353114 Nonexudative age-related macular degeneration, right eye, advanced atrophic with subfoveal involvement: Secondary | ICD-10-CM | POA: Diagnosis not present

## 2021-04-23 DIAGNOSIS — H353222 Exudative age-related macular degeneration, left eye, with inactive choroidal neovascularization: Secondary | ICD-10-CM | POA: Diagnosis not present

## 2021-04-23 DIAGNOSIS — H43813 Vitreous degeneration, bilateral: Secondary | ICD-10-CM | POA: Diagnosis not present

## 2021-04-23 DIAGNOSIS — H3540 Unspecified peripheral retinal degeneration: Secondary | ICD-10-CM | POA: Diagnosis not present

## 2021-05-06 DIAGNOSIS — H10239 Serous conjunctivitis, except viral, unspecified eye: Secondary | ICD-10-CM | POA: Diagnosis not present

## 2021-05-06 DIAGNOSIS — H43813 Vitreous degeneration, bilateral: Secondary | ICD-10-CM | POA: Diagnosis not present

## 2021-05-06 DIAGNOSIS — H02052 Trichiasis without entropian right lower eyelid: Secondary | ICD-10-CM | POA: Diagnosis not present

## 2021-05-06 DIAGNOSIS — Z961 Presence of intraocular lens: Secondary | ICD-10-CM | POA: Diagnosis not present

## 2021-05-06 DIAGNOSIS — H353222 Exudative age-related macular degeneration, left eye, with inactive choroidal neovascularization: Secondary | ICD-10-CM | POA: Diagnosis not present

## 2021-05-06 DIAGNOSIS — H353113 Nonexudative age-related macular degeneration, right eye, advanced atrophic without subfoveal involvement: Secondary | ICD-10-CM | POA: Diagnosis not present

## 2021-05-06 DIAGNOSIS — T1511XA Foreign body in conjunctival sac, right eye, initial encounter: Secondary | ICD-10-CM | POA: Diagnosis not present

## 2021-05-06 DIAGNOSIS — H401131 Primary open-angle glaucoma, bilateral, mild stage: Secondary | ICD-10-CM | POA: Diagnosis not present

## 2021-06-06 ENCOUNTER — Telehealth: Payer: Self-pay | Admitting: Family Medicine

## 2021-06-06 NOTE — Telephone Encounter (Signed)
Left message for patient to call back and schedule Medicare Annual Wellness Visit (AWV) to be done virtually or by telephone.  No hx of AWV eligible as of 11/19/09.  Please schedule at anytime with [facility].      40 Minutes appointment   Any questions, please call me at [phone]

## 2021-06-16 DIAGNOSIS — H02052 Trichiasis without entropian right lower eyelid: Secondary | ICD-10-CM | POA: Diagnosis not present

## 2021-06-16 DIAGNOSIS — H401131 Primary open-angle glaucoma, bilateral, mild stage: Secondary | ICD-10-CM | POA: Diagnosis not present

## 2021-06-16 DIAGNOSIS — Z961 Presence of intraocular lens: Secondary | ICD-10-CM | POA: Diagnosis not present

## 2021-06-16 DIAGNOSIS — H353113 Nonexudative age-related macular degeneration, right eye, advanced atrophic without subfoveal involvement: Secondary | ICD-10-CM | POA: Diagnosis not present

## 2021-06-16 DIAGNOSIS — H02051 Trichiasis without entropian right upper eyelid: Secondary | ICD-10-CM | POA: Diagnosis not present

## 2021-06-16 DIAGNOSIS — H43813 Vitreous degeneration, bilateral: Secondary | ICD-10-CM | POA: Diagnosis not present

## 2021-06-16 DIAGNOSIS — H353222 Exudative age-related macular degeneration, left eye, with inactive choroidal neovascularization: Secondary | ICD-10-CM | POA: Diagnosis not present

## 2021-07-01 ENCOUNTER — Other Ambulatory Visit: Payer: Medicare Other

## 2021-07-01 ENCOUNTER — Other Ambulatory Visit: Payer: Self-pay

## 2021-07-01 DIAGNOSIS — N182 Chronic kidney disease, stage 2 (mild): Secondary | ICD-10-CM

## 2021-07-01 DIAGNOSIS — R7309 Other abnormal glucose: Secondary | ICD-10-CM

## 2021-07-01 DIAGNOSIS — E538 Deficiency of other specified B group vitamins: Secondary | ICD-10-CM | POA: Diagnosis not present

## 2021-07-01 DIAGNOSIS — Z0000 Encounter for general adult medical examination without abnormal findings: Secondary | ICD-10-CM

## 2021-07-01 DIAGNOSIS — I129 Hypertensive chronic kidney disease with stage 1 through stage 4 chronic kidney disease, or unspecified chronic kidney disease: Secondary | ICD-10-CM

## 2021-07-01 DIAGNOSIS — E782 Mixed hyperlipidemia: Secondary | ICD-10-CM | POA: Diagnosis not present

## 2021-07-01 DIAGNOSIS — R351 Nocturia: Secondary | ICD-10-CM

## 2021-07-02 LAB — CBC WITH DIFFERENTIAL/PLATELET
Absolute Monocytes: 897 cells/uL (ref 200–950)
Basophils Absolute: 61 cells/uL (ref 0–200)
Basophils Relative: 0.8 %
Eosinophils Absolute: 433 cells/uL (ref 15–500)
Eosinophils Relative: 5.7 %
HCT: 41.2 % (ref 38.5–50.0)
Hemoglobin: 14 g/dL (ref 13.2–17.1)
Lymphs Abs: 1406 cells/uL (ref 850–3900)
MCH: 31.7 pg (ref 27.0–33.0)
MCHC: 34 g/dL (ref 32.0–36.0)
MCV: 93.2 fL (ref 80.0–100.0)
MPV: 9 fL (ref 7.5–12.5)
Monocytes Relative: 11.8 %
Neutro Abs: 4803 cells/uL (ref 1500–7800)
Neutrophils Relative %: 63.2 %
Platelets: 252 10*3/uL (ref 140–400)
RBC: 4.42 10*6/uL (ref 4.20–5.80)
RDW: 12.3 % (ref 11.0–15.0)
Total Lymphocyte: 18.5 %
WBC: 7.6 10*3/uL (ref 3.8–10.8)

## 2021-07-02 LAB — COMPLETE METABOLIC PANEL WITH GFR
AG Ratio: 1.4 (calc) (ref 1.0–2.5)
ALT: 16 U/L (ref 9–46)
AST: 16 U/L (ref 10–35)
Albumin: 4.2 g/dL (ref 3.6–5.1)
Alkaline phosphatase (APISO): 85 U/L (ref 35–144)
BUN: 20 mg/dL (ref 7–25)
CO2: 26 mmol/L (ref 20–32)
Calcium: 9 mg/dL (ref 8.6–10.3)
Chloride: 104 mmol/L (ref 98–110)
Creat: 1.07 mg/dL (ref 0.70–1.22)
Globulin: 2.9 g/dL (calc) (ref 1.9–3.7)
Glucose, Bld: 144 mg/dL — ABNORMAL HIGH (ref 65–139)
Potassium: 4.2 mmol/L (ref 3.5–5.3)
Sodium: 138 mmol/L (ref 135–146)
Total Bilirubin: 0.6 mg/dL (ref 0.2–1.2)
Total Protein: 7.1 g/dL (ref 6.1–8.1)
eGFR: 69 mL/min/{1.73_m2} (ref >=60)

## 2021-07-02 LAB — LIPID PANEL
Cholesterol: 93 mg/dL (ref <200)
HDL: 37 mg/dL — ABNORMAL LOW (ref >=40)
LDL Cholesterol (Calc): 34 mg/dL (calc)
Non-HDL Cholesterol (Calc): 56 mg/dL (calc) (ref <130)
Total CHOL/HDL Ratio: 2.5 (calc) (ref <5.0)
Triglycerides: 139 mg/dL (ref <150)

## 2021-07-02 LAB — PSA: PSA: 1.28 ng/mL (ref <=4.00)

## 2021-07-02 LAB — HEMOGLOBIN A1C
Hgb A1c MFr Bld: 5.7 % of total Hgb — ABNORMAL HIGH (ref <5.7)
Mean Plasma Glucose: 117 mg/dL
eAG (mmol/L): 6.5 mmol/L

## 2021-07-02 LAB — TSH: TSH: 6.28 mIU/L — ABNORMAL HIGH (ref 0.40–4.50)

## 2021-07-02 LAB — VITAMIN B12: Vitamin B-12: 275 pg/mL (ref 200–1100)

## 2021-07-03 ENCOUNTER — Other Ambulatory Visit: Payer: Medicare Other

## 2021-07-04 ENCOUNTER — Other Ambulatory Visit: Payer: Self-pay

## 2021-07-08 ENCOUNTER — Encounter: Payer: Medicare Other | Admitting: Family Medicine

## 2021-07-09 ENCOUNTER — Encounter: Payer: Medicare Other | Admitting: Family Medicine

## 2021-07-15 ENCOUNTER — Telehealth: Payer: Self-pay

## 2021-07-15 NOTE — Telephone Encounter (Signed)
I called and spoke with his wife, Judson Roch and went over his results. She had no questions at this time in relation to them and she's aware that Dr. Raliegh Ip will go over them again when they come in for his appt in a few weeks.

## 2021-07-15 NOTE — Progress Notes (Signed)
Other message has documentation that I have spoken with his wife, Judson Roch.

## 2021-07-15 NOTE — Telephone Encounter (Signed)
Please contact patient to review the following (No MyChart Access):  His wife Judson Roch asked for his lab results today in office. See separate phone call.  Also his apt was scheduled for end of September 2022, so we will review these lab results at upcoming visit.  1. Chemistry - Normal results, including electrolytes, kidney and liver function.  Elevated fasting sugar.  2. Hemoglobin A1c (Diabetes screening) - 5.7, slightly elevated in range of Pre-Diabetes (>5.7 to 6.4)   3. PSA Prostate Cancer Screening - 1.28, negative.  4. TSH Thyroid Function Tests - Elevated TSH 6.28 increased from prior results. This may be a sign of early low thyroid problem may need to repeat lab before we consider medication. We can review in office.  5. B12 low end of normal range 275 but still in normal range. Continue to take Vitamin B12 1,024mg once daily OTC  6. Cholesterol - Well controlled on Cholesterol medication atorvastatin.  7. CBC Blood Counts - Normal, no anemia, other abnormality  ANobie Putnam DO [facility] 07/15/2021, 10:24 AM

## 2021-07-17 DIAGNOSIS — Z7689 Persons encountering health services in other specified circumstances: Secondary | ICD-10-CM | POA: Diagnosis not present

## 2021-07-17 DIAGNOSIS — L400 Psoriasis vulgaris: Secondary | ICD-10-CM | POA: Diagnosis not present

## 2021-07-17 DIAGNOSIS — Z79899 Other long term (current) drug therapy: Secondary | ICD-10-CM | POA: Diagnosis not present

## 2021-07-22 DIAGNOSIS — Z7689 Persons encountering health services in other specified circumstances: Secondary | ICD-10-CM | POA: Diagnosis not present

## 2021-07-29 DIAGNOSIS — I2510 Atherosclerotic heart disease of native coronary artery without angina pectoris: Secondary | ICD-10-CM | POA: Diagnosis not present

## 2021-07-29 DIAGNOSIS — E785 Hyperlipidemia, unspecified: Secondary | ICD-10-CM | POA: Diagnosis not present

## 2021-07-29 DIAGNOSIS — I25118 Atherosclerotic heart disease of native coronary artery with other forms of angina pectoris: Secondary | ICD-10-CM | POA: Diagnosis not present

## 2021-07-29 DIAGNOSIS — I213 ST elevation (STEMI) myocardial infarction of unspecified site: Secondary | ICD-10-CM | POA: Diagnosis not present

## 2021-07-29 DIAGNOSIS — R001 Bradycardia, unspecified: Secondary | ICD-10-CM | POA: Diagnosis not present

## 2021-07-29 DIAGNOSIS — Z955 Presence of coronary angioplasty implant and graft: Secondary | ICD-10-CM | POA: Diagnosis not present

## 2021-07-29 DIAGNOSIS — Z87891 Personal history of nicotine dependence: Secondary | ICD-10-CM | POA: Diagnosis not present

## 2021-07-29 DIAGNOSIS — I228 Subsequent ST elevation (STEMI) myocardial infarction of other sites: Secondary | ICD-10-CM | POA: Diagnosis not present

## 2021-07-29 DIAGNOSIS — R011 Cardiac murmur, unspecified: Secondary | ICD-10-CM | POA: Diagnosis not present

## 2021-07-29 DIAGNOSIS — Z7689 Persons encountering health services in other specified circumstances: Secondary | ICD-10-CM | POA: Diagnosis not present

## 2021-07-29 DIAGNOSIS — I208 Other forms of angina pectoris: Secondary | ICD-10-CM | POA: Diagnosis not present

## 2021-07-30 ENCOUNTER — Other Ambulatory Visit: Payer: Self-pay

## 2021-07-30 ENCOUNTER — Other Ambulatory Visit: Payer: Self-pay | Admitting: Family Medicine

## 2021-07-30 ENCOUNTER — Encounter: Payer: Self-pay | Admitting: Family Medicine

## 2021-07-30 ENCOUNTER — Ambulatory Visit (INDEPENDENT_AMBULATORY_CARE_PROVIDER_SITE_OTHER): Payer: Medicare Other | Admitting: Family Medicine

## 2021-07-30 VITALS — BP 130/70 | HR 62 | Ht 71.0 in | Wt 202.2 lb

## 2021-07-30 DIAGNOSIS — N182 Chronic kidney disease, stage 2 (mild): Secondary | ICD-10-CM

## 2021-07-30 DIAGNOSIS — I2510 Atherosclerotic heart disease of native coronary artery without angina pectoris: Secondary | ICD-10-CM

## 2021-07-30 DIAGNOSIS — E538 Deficiency of other specified B group vitamins: Secondary | ICD-10-CM | POA: Diagnosis not present

## 2021-07-30 DIAGNOSIS — Z23 Encounter for immunization: Secondary | ICD-10-CM

## 2021-07-30 DIAGNOSIS — E782 Mixed hyperlipidemia: Secondary | ICD-10-CM

## 2021-07-30 DIAGNOSIS — Z0000 Encounter for general adult medical examination without abnormal findings: Secondary | ICD-10-CM | POA: Diagnosis not present

## 2021-07-30 DIAGNOSIS — D508 Other iron deficiency anemias: Secondary | ICD-10-CM

## 2021-07-30 DIAGNOSIS — R7989 Other specified abnormal findings of blood chemistry: Secondary | ICD-10-CM

## 2021-07-30 DIAGNOSIS — I129 Hypertensive chronic kidney disease with stage 1 through stage 4 chronic kidney disease, or unspecified chronic kidney disease: Secondary | ICD-10-CM

## 2021-07-30 DIAGNOSIS — R351 Nocturia: Secondary | ICD-10-CM

## 2021-07-30 DIAGNOSIS — R7309 Other abnormal glucose: Secondary | ICD-10-CM | POA: Diagnosis not present

## 2021-07-30 DIAGNOSIS — H353222 Exudative age-related macular degeneration, left eye, with inactive choroidal neovascularization: Secondary | ICD-10-CM | POA: Diagnosis not present
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
## 2021-07-30 NOTE — Patient Instructions (Addendum)
Thank you for coming to the office today.  Flu Shot today  1. Chemistry - Normal results, including electrolytes, kidney and liver function.  Elevated fasting sugar.   2. Hemoglobin A1c (Diabetes screening) - 5.7, slightly elevated in range of Pre-Diabetes (>5.7 to 6.4)    3. PSA Prostate Cancer Screening - 1.28, negative.   4. TSH Thyroid Function Tests - Elevated TSH 6.28 increased from prior results. This may be a sign of early low thyroid problem may need to repeat lab before we consider medication. We can review in office.   5. B12 low end of normal range 275 but still in normal range. Continue to take Vitamin B12 1,066mcg once daily OTC   6. Cholesterol - Well controlled on Cholesterol medication atorvastatin.   7. CBC Blood Counts - Normal, no anemia, other abnormality  DUE for FASTING BLOOD WORK (no food or drink after midnight before the lab appointment, only water or coffee without cream/sugar on the morning of)  SCHEDULE "Lab Only" visit in the morning at the clinic for lab draw in  1 YEAR  - Make sure Lab Only appointment is at about 1 week before your next appointment, so that results will be available  For Lab Results, once available within 2-3 days of blood draw, you can can log in to MyChart online to view your results and a brief explanation. Also, we can discuss results at next follow-up visit.    Please schedule a Follow-up Appointment to: Return in about 1 year (around 07/30/2022) for 1 year fasting lab only then 1 week later Annual Physical.  If you have any other questions or concerns, please feel free to call the office or send a message through [place]. You may also schedule an earlier appointment if necessary.  Additionally, you may be receiving a survey about your experience at our office within a few days to 1 week by e-mail or mail. We value your feedback.  [name], DO [place]

## 2021-08-14 ENCOUNTER — Emergency Department
Admission: EM | Admit: 2021-08-14 | Discharge: 2021-08-15 | Disposition: A | Discharge status: Home / Self Care | Payer: Medicare Other | Attending: Emergency Medicine | Admitting: Emergency Medicine

## 2021-08-14 ENCOUNTER — Emergency Department: Payer: Medicare Other

## 2021-08-14 ENCOUNTER — Other Ambulatory Visit: Payer: Self-pay

## 2021-08-14 DIAGNOSIS — I2510 Atherosclerotic heart disease of native coronary artery without angina pectoris: Secondary | ICD-10-CM | POA: Diagnosis not present

## 2021-08-14 DIAGNOSIS — Y92008 Other place in unspecified non-institutional (private) residence as the place of occurrence of the external cause: Secondary | ICD-10-CM | POA: Diagnosis not present

## 2021-08-14 DIAGNOSIS — S069X9A Unspecified intracranial injury with loss of consciousness of unspecified duration, initial encounter: Secondary | ICD-10-CM | POA: Diagnosis not present

## 2021-08-14 DIAGNOSIS — S0101XA Laceration without foreign body of scalp, initial encounter: Secondary | ICD-10-CM | POA: Diagnosis not present

## 2021-08-14 DIAGNOSIS — Z7982 Long term (current) use of aspirin: Secondary | ICD-10-CM | POA: Diagnosis not present

## 2021-08-14 DIAGNOSIS — W132XXA Fall from, out of or through roof, initial encounter: Secondary | ICD-10-CM | POA: Insufficient documentation

## 2021-08-14 DIAGNOSIS — I129 Hypertensive chronic kidney disease with stage 1 through stage 4 chronic kidney disease, or unspecified chronic kidney disease: Secondary | ICD-10-CM | POA: Diagnosis not present

## 2021-08-14 DIAGNOSIS — S41111A Laceration without foreign body of right upper arm, initial encounter: Secondary | ICD-10-CM | POA: Insufficient documentation

## 2021-08-14 DIAGNOSIS — S32010A Wedge compression fracture of first lumbar vertebra, initial encounter for closed fracture: Secondary | ICD-10-CM | POA: Diagnosis not present

## 2021-08-14 DIAGNOSIS — Z743 Need for continuous supervision: Secondary | ICD-10-CM | POA: Diagnosis not present

## 2021-08-14 DIAGNOSIS — S0001XA Abrasion of scalp, initial encounter: Secondary | ICD-10-CM

## 2021-08-14 DIAGNOSIS — R52 Pain, unspecified: Secondary | ICD-10-CM | POA: Diagnosis not present

## 2021-08-14 DIAGNOSIS — M549 Dorsalgia, unspecified: Secondary | ICD-10-CM | POA: Diagnosis not present

## 2021-08-14 DIAGNOSIS — S32018A Other fracture of first lumbar vertebra, initial encounter for closed fracture: Secondary | ICD-10-CM | POA: Diagnosis not present

## 2021-08-14 DIAGNOSIS — Z79899 Other long term (current) drug therapy: Secondary | ICD-10-CM | POA: Insufficient documentation

## 2021-08-14 DIAGNOSIS — W19XXXA Unspecified fall, initial encounter: Secondary | ICD-10-CM | POA: Diagnosis not present

## 2021-08-14 DIAGNOSIS — Z87891 Personal history of nicotine dependence: Secondary | ICD-10-CM | POA: Diagnosis not present

## 2021-08-14 DIAGNOSIS — G319 Degenerative disease of nervous system, unspecified: Secondary | ICD-10-CM | POA: Diagnosis not present

## 2021-08-14 DIAGNOSIS — S0990XA Unspecified injury of head, initial encounter: Secondary | ICD-10-CM | POA: Diagnosis not present

## 2021-08-14 DIAGNOSIS — N182 Chronic kidney disease, stage 2 (mild): Secondary | ICD-10-CM | POA: Diagnosis not present

## 2021-08-14 DIAGNOSIS — R0689 Other abnormalities of breathing: Secondary | ICD-10-CM | POA: Diagnosis not present

## 2021-08-14 DIAGNOSIS — S3992XA Unspecified injury of lower back, initial encounter: Secondary | ICD-10-CM | POA: Diagnosis present

## 2021-08-14 DIAGNOSIS — M545 Low back pain, unspecified: Secondary | ICD-10-CM | POA: Diagnosis not present

## 2021-08-14 DIAGNOSIS — Z043 Encounter for examination and observation following other accident: Secondary | ICD-10-CM | POA: Diagnosis not present

## 2021-08-14 MED ORDER — OXYCODONE-ACETAMINOPHEN 5-325 MG PO TABS
1.0000 | ORAL_TABLET | Freq: Once | ORAL | Status: AC
  Administered 2021-08-14: 1 via ORAL
  Filled 2021-08-14: qty 1

## 2021-08-14 MED ORDER — OXYCODONE HCL 5 MG PO TABS: 5.0000 mg | ORAL_TABLET | Freq: Four times a day (QID) | ORAL | 0 refills | Status: DC | PRN
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
## 2021-08-14 NOTE — Discharge Instructions (Signed)
Take tylenol 1000mg  three times a day for the next 5 days and apply ice off and on to your lower back tomorrow.

## 2021-08-14 NOTE — ED Notes (Signed)
Pt taken for scans 

## 2021-08-14 NOTE — ED Notes (Signed)
Pt back from scans, pt wife at bedside

## 2021-08-14 NOTE — ED Notes (Signed)
Pt able to stand and states back feels better than earlier
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
## 2021-08-19 ENCOUNTER — Telehealth: Payer: Self-pay | Admitting: Family Medicine

## 2021-08-19 DIAGNOSIS — S32010A Wedge compression fracture of first lumbar vertebra, initial encounter for closed fracture: Secondary | ICD-10-CM | POA: Diagnosis not present

## 2021-08-19 NOTE — Telephone Encounter (Signed)
Left message for patient to call back and schedule Medicare Annual Wellness Visit (AWV) to be done virtually or by telephone.  No hx of AWV eligible as of 11/09/09  Please schedule at anytime with [facility].      40 Minutes appointment   Any questions, please call me at [phone]

## 2021-09-17 DIAGNOSIS — S32010A Wedge compression fracture of first lumbar vertebra, initial encounter for closed fracture: Secondary | ICD-10-CM | POA: Diagnosis not present

## 2021-09-19 ENCOUNTER — Encounter: Payer: Medicare Other | Admitting: Family Medicine

## 2021-09-24 DIAGNOSIS — Z136 Encounter for screening for cardiovascular disorders: Secondary | ICD-10-CM | POA: Diagnosis not present

## 2021-09-24 DIAGNOSIS — I739 Peripheral vascular disease, unspecified: Secondary | ICD-10-CM | POA: Diagnosis not present

## 2021-09-24 DIAGNOSIS — I213 ST elevation (STEMI) myocardial infarction of unspecified site: Secondary | ICD-10-CM | POA: Diagnosis not present

## 2021-09-24 DIAGNOSIS — I2510 Atherosclerotic heart disease of native coronary artery without angina pectoris: Secondary | ICD-10-CM | POA: Diagnosis not present

## 2021-09-25 DIAGNOSIS — H401131 Primary open-angle glaucoma, bilateral, mild stage: Secondary | ICD-10-CM | POA: Diagnosis not present

## 2021-09-25 DIAGNOSIS — H43813 Vitreous degeneration, bilateral: Secondary | ICD-10-CM | POA: Diagnosis not present

## 2021-09-25 DIAGNOSIS — H353113 Nonexudative age-related macular degeneration, right eye, advanced atrophic without subfoveal involvement: Secondary | ICD-10-CM | POA: Diagnosis not present

## 2021-09-25 DIAGNOSIS — Z961 Presence of intraocular lens: Secondary | ICD-10-CM | POA: Diagnosis not present

## 2021-09-25 DIAGNOSIS — H02051 Trichiasis without entropian right upper eyelid: Secondary | ICD-10-CM | POA: Diagnosis not present

## 2021-09-25 DIAGNOSIS — H353222 Exudative age-related macular degeneration, left eye, with inactive choroidal neovascularization: Secondary | ICD-10-CM | POA: Diagnosis not present

## 2021-09-25 DIAGNOSIS — H02052 Trichiasis without entropian right lower eyelid: Secondary | ICD-10-CM | POA: Diagnosis not present

## 2021-09-29 DIAGNOSIS — I25118 Atherosclerotic heart disease of native coronary artery with other forms of angina pectoris: Secondary | ICD-10-CM | POA: Diagnosis not present

## 2021-09-29 DIAGNOSIS — Z87891 Personal history of nicotine dependence: Secondary | ICD-10-CM | POA: Diagnosis not present

## 2021-09-29 DIAGNOSIS — I2510 Atherosclerotic heart disease of native coronary artery without angina pectoris: Secondary | ICD-10-CM | POA: Diagnosis not present

## 2021-09-29 DIAGNOSIS — Z955 Presence of coronary angioplasty implant and graft: Secondary | ICD-10-CM | POA: Diagnosis not present

## 2021-09-29 DIAGNOSIS — R001 Bradycardia, unspecified: Secondary | ICD-10-CM | POA: Diagnosis not present

## 2021-09-29 DIAGNOSIS — E785 Hyperlipidemia, unspecified: Secondary | ICD-10-CM | POA: Diagnosis not present

## 2021-09-29 DIAGNOSIS — I208 Other forms of angina pectoris: Secondary | ICD-10-CM | POA: Diagnosis not present

## 2021-09-29 DIAGNOSIS — R011 Cardiac murmur, unspecified: Secondary | ICD-10-CM | POA: Diagnosis not present

## 2021-10-29 DIAGNOSIS — H43813 Vitreous degeneration, bilateral: Secondary | ICD-10-CM | POA: Diagnosis not present

## 2021-10-29 DIAGNOSIS — H3540 Unspecified peripheral retinal degeneration: Secondary | ICD-10-CM | POA: Diagnosis not present

## 2021-10-29 DIAGNOSIS — H353222 Exudative age-related macular degeneration, left eye, with inactive choroidal neovascularization: Secondary | ICD-10-CM | POA: Diagnosis not present

## 2021-10-29 DIAGNOSIS — H353114 Nonexudative age-related macular degeneration, right eye, advanced atrophic with subfoveal involvement: Secondary | ICD-10-CM | POA: Diagnosis not present

## 2021-12-08 DIAGNOSIS — H02052 Trichiasis without entropian right lower eyelid: Secondary | ICD-10-CM | POA: Diagnosis not present

## 2021-12-08 DIAGNOSIS — H401131 Primary open-angle glaucoma, bilateral, mild stage: Secondary | ICD-10-CM | POA: Diagnosis not present

## 2021-12-08 DIAGNOSIS — H353113 Nonexudative age-related macular degeneration, right eye, advanced atrophic without subfoveal involvement: Secondary | ICD-10-CM | POA: Diagnosis not present

## 2021-12-08 DIAGNOSIS — H02051 Trichiasis without entropian right upper eyelid: Secondary | ICD-10-CM | POA: Diagnosis not present

## 2021-12-08 DIAGNOSIS — H353222 Exudative age-related macular degeneration, left eye, with inactive choroidal neovascularization: Secondary | ICD-10-CM | POA: Diagnosis not present

## 2021-12-08 DIAGNOSIS — Z961 Presence of intraocular lens: Secondary | ICD-10-CM | POA: Diagnosis not present

## 2021-12-08 DIAGNOSIS — H43813 Vitreous degeneration, bilateral: Secondary | ICD-10-CM | POA: Diagnosis not present

## 2021-12-08 DIAGNOSIS — H02055 Trichiasis without entropian left lower eyelid: Secondary | ICD-10-CM | POA: Diagnosis not present

## 2021-12-24 DIAGNOSIS — H43813 Vitreous degeneration, bilateral: Secondary | ICD-10-CM | POA: Diagnosis not present

## 2021-12-24 DIAGNOSIS — Z961 Presence of intraocular lens: Secondary | ICD-10-CM | POA: Diagnosis not present

## 2021-12-24 DIAGNOSIS — H401131 Primary open-angle glaucoma, bilateral, mild stage: Secondary | ICD-10-CM | POA: Diagnosis not present

## 2021-12-24 DIAGNOSIS — H353222 Exudative age-related macular degeneration, left eye, with inactive choroidal neovascularization: Secondary | ICD-10-CM | POA: Diagnosis not present

## 2021-12-24 DIAGNOSIS — H353113 Nonexudative age-related macular degeneration, right eye, advanced atrophic without subfoveal involvement: Secondary | ICD-10-CM | POA: Diagnosis not present

## 2021-12-29 DIAGNOSIS — M9902 Segmental and somatic dysfunction of thoracic region: Secondary | ICD-10-CM | POA: Diagnosis not present

## 2021-12-29 DIAGNOSIS — M9901 Segmental and somatic dysfunction of cervical region: Secondary | ICD-10-CM | POA: Diagnosis not present

## 2021-12-29 DIAGNOSIS — M542 Cervicalgia: Secondary | ICD-10-CM | POA: Diagnosis not present

## 2021-12-29 DIAGNOSIS — M47892 Other spondylosis, cervical region: Secondary | ICD-10-CM | POA: Diagnosis not present

## 2021-12-30 DIAGNOSIS — M47892 Other spondylosis, cervical region: Secondary | ICD-10-CM | POA: Diagnosis not present

## 2021-12-30 DIAGNOSIS — M9902 Segmental and somatic dysfunction of thoracic region: Secondary | ICD-10-CM | POA: Diagnosis not present

## 2021-12-30 DIAGNOSIS — M542 Cervicalgia: Secondary | ICD-10-CM | POA: Diagnosis not present

## 2021-12-30 DIAGNOSIS — M9901 Segmental and somatic dysfunction of cervical region: Secondary | ICD-10-CM | POA: Diagnosis not present

## 2021-12-31 DIAGNOSIS — M9902 Segmental and somatic dysfunction of thoracic region: Secondary | ICD-10-CM | POA: Diagnosis not present

## 2021-12-31 DIAGNOSIS — M9901 Segmental and somatic dysfunction of cervical region: Secondary | ICD-10-CM | POA: Diagnosis not present

## 2021-12-31 DIAGNOSIS — M47892 Other spondylosis, cervical region: Secondary | ICD-10-CM | POA: Diagnosis not present

## 2021-12-31 DIAGNOSIS — M542 Cervicalgia: Secondary | ICD-10-CM | POA: Diagnosis not present

## 2022-01-02 DIAGNOSIS — M9902 Segmental and somatic dysfunction of thoracic region: Secondary | ICD-10-CM | POA: Diagnosis not present

## 2022-01-02 DIAGNOSIS — M542 Cervicalgia: Secondary | ICD-10-CM | POA: Diagnosis not present

## 2022-01-02 DIAGNOSIS — M47892 Other spondylosis, cervical region: Secondary | ICD-10-CM | POA: Diagnosis not present

## 2022-01-02 DIAGNOSIS — M9901 Segmental and somatic dysfunction of cervical region: Secondary | ICD-10-CM | POA: Diagnosis not present

## 2022-01-05 DIAGNOSIS — M542 Cervicalgia: Secondary | ICD-10-CM | POA: Diagnosis not present

## 2022-01-05 DIAGNOSIS — M9901 Segmental and somatic dysfunction of cervical region: Secondary | ICD-10-CM | POA: Diagnosis not present

## 2022-01-05 DIAGNOSIS — M9902 Segmental and somatic dysfunction of thoracic region: Secondary | ICD-10-CM | POA: Diagnosis not present

## 2022-01-05 DIAGNOSIS — M47892 Other spondylosis, cervical region: Secondary | ICD-10-CM | POA: Diagnosis not present

## 2022-01-07 DIAGNOSIS — M9902 Segmental and somatic dysfunction of thoracic region: Secondary | ICD-10-CM | POA: Diagnosis not present

## 2022-01-07 DIAGNOSIS — M542 Cervicalgia: Secondary | ICD-10-CM | POA: Diagnosis not present

## 2022-01-07 DIAGNOSIS — M47892 Other spondylosis, cervical region: Secondary | ICD-10-CM | POA: Diagnosis not present

## 2022-01-07 DIAGNOSIS — M9901 Segmental and somatic dysfunction of cervical region: Secondary | ICD-10-CM | POA: Diagnosis not present

## 2022-01-09 DIAGNOSIS — M9902 Segmental and somatic dysfunction of thoracic region: Secondary | ICD-10-CM | POA: Diagnosis not present

## 2022-01-09 DIAGNOSIS — M9901 Segmental and somatic dysfunction of cervical region: Secondary | ICD-10-CM | POA: Diagnosis not present

## 2022-01-09 DIAGNOSIS — M47892 Other spondylosis, cervical region: Secondary | ICD-10-CM | POA: Diagnosis not present

## 2022-01-09 DIAGNOSIS — M542 Cervicalgia: Secondary | ICD-10-CM | POA: Diagnosis not present

## 2022-01-12 DIAGNOSIS — M9901 Segmental and somatic dysfunction of cervical region: Secondary | ICD-10-CM | POA: Diagnosis not present

## 2022-01-12 DIAGNOSIS — M542 Cervicalgia: Secondary | ICD-10-CM | POA: Diagnosis not present

## 2022-01-12 DIAGNOSIS — M9902 Segmental and somatic dysfunction of thoracic region: Secondary | ICD-10-CM | POA: Diagnosis not present

## 2022-01-12 DIAGNOSIS — M47892 Other spondylosis, cervical region: Secondary | ICD-10-CM | POA: Diagnosis not present

## 2022-01-14 DIAGNOSIS — Z79899 Other long term (current) drug therapy: Secondary | ICD-10-CM | POA: Diagnosis not present

## 2022-01-14 DIAGNOSIS — M9902 Segmental and somatic dysfunction of thoracic region: Secondary | ICD-10-CM | POA: Diagnosis not present

## 2022-01-14 DIAGNOSIS — M542 Cervicalgia: Secondary | ICD-10-CM | POA: Diagnosis not present

## 2022-01-14 DIAGNOSIS — L400 Psoriasis vulgaris: Secondary | ICD-10-CM | POA: Diagnosis not present

## 2022-01-14 DIAGNOSIS — M47892 Other spondylosis, cervical region: Secondary | ICD-10-CM | POA: Diagnosis not present

## 2022-01-14 DIAGNOSIS — M9901 Segmental and somatic dysfunction of cervical region: Secondary | ICD-10-CM | POA: Diagnosis not present

## 2022-01-16 DIAGNOSIS — M47892 Other spondylosis, cervical region: Secondary | ICD-10-CM | POA: Diagnosis not present

## 2022-01-16 DIAGNOSIS — M9902 Segmental and somatic dysfunction of thoracic region: Secondary | ICD-10-CM | POA: Diagnosis not present

## 2022-01-16 DIAGNOSIS — M9901 Segmental and somatic dysfunction of cervical region: Secondary | ICD-10-CM | POA: Diagnosis not present

## 2022-01-16 DIAGNOSIS — M542 Cervicalgia: Secondary | ICD-10-CM | POA: Diagnosis not present

## 2022-01-19 DIAGNOSIS — M9901 Segmental and somatic dysfunction of cervical region: Secondary | ICD-10-CM | POA: Diagnosis not present

## 2022-01-19 DIAGNOSIS — M9902 Segmental and somatic dysfunction of thoracic region: Secondary | ICD-10-CM | POA: Diagnosis not present

## 2022-01-19 DIAGNOSIS — M47892 Other spondylosis, cervical region: Secondary | ICD-10-CM | POA: Diagnosis not present

## 2022-01-19 DIAGNOSIS — M542 Cervicalgia: Secondary | ICD-10-CM | POA: Diagnosis not present

## 2022-01-21 DIAGNOSIS — H353222 Exudative age-related macular degeneration, left eye, with inactive choroidal neovascularization: Secondary | ICD-10-CM | POA: Diagnosis not present

## 2022-01-21 DIAGNOSIS — M47892 Other spondylosis, cervical region: Secondary | ICD-10-CM | POA: Diagnosis not present

## 2022-01-21 DIAGNOSIS — M9901 Segmental and somatic dysfunction of cervical region: Secondary | ICD-10-CM | POA: Diagnosis not present

## 2022-01-21 DIAGNOSIS — M9902 Segmental and somatic dysfunction of thoracic region: Secondary | ICD-10-CM | POA: Diagnosis not present

## 2022-01-21 DIAGNOSIS — H353114 Nonexudative age-related macular degeneration, right eye, advanced atrophic with subfoveal involvement: Secondary | ICD-10-CM | POA: Diagnosis not present

## 2022-01-21 DIAGNOSIS — M542 Cervicalgia: Secondary | ICD-10-CM | POA: Diagnosis not present

## 2022-01-26 DIAGNOSIS — M542 Cervicalgia: Secondary | ICD-10-CM | POA: Diagnosis not present

## 2022-01-26 DIAGNOSIS — M9901 Segmental and somatic dysfunction of cervical region: Secondary | ICD-10-CM | POA: Diagnosis not present

## 2022-01-26 DIAGNOSIS — M9902 Segmental and somatic dysfunction of thoracic region: Secondary | ICD-10-CM | POA: Diagnosis not present

## 2022-01-26 DIAGNOSIS — Z79899 Other long term (current) drug therapy: Secondary | ICD-10-CM | POA: Diagnosis not present

## 2022-01-26 DIAGNOSIS — M47892 Other spondylosis, cervical region: Secondary | ICD-10-CM | POA: Diagnosis not present

## 2022-01-28 DIAGNOSIS — M542 Cervicalgia: Secondary | ICD-10-CM | POA: Diagnosis not present

## 2022-01-28 DIAGNOSIS — M47892 Other spondylosis, cervical region: Secondary | ICD-10-CM | POA: Diagnosis not present

## 2022-01-28 DIAGNOSIS — M9901 Segmental and somatic dysfunction of cervical region: Secondary | ICD-10-CM | POA: Diagnosis not present

## 2022-01-28 DIAGNOSIS — M9902 Segmental and somatic dysfunction of thoracic region: Secondary | ICD-10-CM | POA: Diagnosis not present

## 2022-02-02 DIAGNOSIS — M9901 Segmental and somatic dysfunction of cervical region: Secondary | ICD-10-CM | POA: Diagnosis not present

## 2022-02-02 DIAGNOSIS — M9902 Segmental and somatic dysfunction of thoracic region: Secondary | ICD-10-CM | POA: Diagnosis not present

## 2022-02-02 DIAGNOSIS — M542 Cervicalgia: Secondary | ICD-10-CM | POA: Diagnosis not present

## 2022-02-02 DIAGNOSIS — M47892 Other spondylosis, cervical region: Secondary | ICD-10-CM | POA: Diagnosis not present

## 2022-02-04 DIAGNOSIS — M47892 Other spondylosis, cervical region: Secondary | ICD-10-CM | POA: Diagnosis not present

## 2022-02-04 DIAGNOSIS — M542 Cervicalgia: Secondary | ICD-10-CM | POA: Diagnosis not present

## 2022-02-04 DIAGNOSIS — M9902 Segmental and somatic dysfunction of thoracic region: Secondary | ICD-10-CM | POA: Diagnosis not present

## 2022-02-04 DIAGNOSIS — M9901 Segmental and somatic dysfunction of cervical region: Secondary | ICD-10-CM | POA: Diagnosis not present

## 2022-02-10 DIAGNOSIS — M9901 Segmental and somatic dysfunction of cervical region: Secondary | ICD-10-CM | POA: Diagnosis not present

## 2022-02-10 DIAGNOSIS — M9902 Segmental and somatic dysfunction of thoracic region: Secondary | ICD-10-CM | POA: Diagnosis not present

## 2022-02-10 DIAGNOSIS — M542 Cervicalgia: Secondary | ICD-10-CM | POA: Diagnosis not present

## 2022-02-10 DIAGNOSIS — M47892 Other spondylosis, cervical region: Secondary | ICD-10-CM | POA: Diagnosis not present

## 2022-02-16 ENCOUNTER — Telehealth: Payer: Self-pay

## 2022-02-16 NOTE — Telephone Encounter (Signed)
I spoke with the patient about scheduling his Medicare Annual Wellness Visit (AWV). The pt declined at this time because he said that his wife is in the process of surgery and its not a good time. He said he will schedule at a later date. ?

## 2022-02-17 DIAGNOSIS — M542 Cervicalgia: Secondary | ICD-10-CM | POA: Diagnosis not present

## 2022-02-17 DIAGNOSIS — M9902 Segmental and somatic dysfunction of thoracic region: Secondary | ICD-10-CM | POA: Diagnosis not present

## 2022-02-17 DIAGNOSIS — M9901 Segmental and somatic dysfunction of cervical region: Secondary | ICD-10-CM | POA: Diagnosis not present

## 2022-02-17 DIAGNOSIS — M47892 Other spondylosis, cervical region: Secondary | ICD-10-CM | POA: Diagnosis not present

## 2022-02-24 DIAGNOSIS — M9901 Segmental and somatic dysfunction of cervical region: Secondary | ICD-10-CM | POA: Diagnosis not present

## 2022-02-24 DIAGNOSIS — M542 Cervicalgia: Secondary | ICD-10-CM | POA: Diagnosis not present

## 2022-02-24 DIAGNOSIS — M47892 Other spondylosis, cervical region: Secondary | ICD-10-CM | POA: Diagnosis not present

## 2022-02-24 DIAGNOSIS — M9902 Segmental and somatic dysfunction of thoracic region: Secondary | ICD-10-CM | POA: Diagnosis not present

## 2022-03-11 ENCOUNTER — Ambulatory Visit (INDEPENDENT_AMBULATORY_CARE_PROVIDER_SITE_OTHER): Payer: Medicare Other | Admitting: Family Medicine

## 2022-03-11 ENCOUNTER — Encounter: Payer: Self-pay | Admitting: Family Medicine

## 2022-03-11 VITALS — Ht 71.0 in | Wt 200.0 lb

## 2022-03-11 DIAGNOSIS — J0110 Acute frontal sinusitis, unspecified: Secondary | ICD-10-CM

## 2022-03-11 DIAGNOSIS — J029 Acute pharyngitis, unspecified: Secondary | ICD-10-CM | POA: Diagnosis not present

## 2022-03-11 NOTE — Patient Instructions (Addendum)
Thank you for coming to the office today. ? ?1. It sounds like you have persistent Sinus Congestion or "Rhinosinusitis" - I do not think that this is a Bacterial Sinus Infection. Usually these are caused by Viruses or Allergies, and will run it's course in about 7 to 10 days. ?- No antibiotics are needed ?- Keep on Flonase 2 sprays each nostril ?- may add Claritin if not taking ?- Recommend using Nasal Saline spray multiple times a day to help flush out congestion and clear sinuses ?- Improve hydration by drinking plenty of clear fluids (water, gatorade) to reduce secretions and thin congestion ?- Congestion draining down throat can cause irritation. May try warm herbal tea with honey, cough drops ?- Can take Tylenol or Ibuprofen as needed for fevers ?- May continue over the counter cold medicine as you are, I would not use any decongestant or mucinex longer than 7 days. ? ?Notify us if worse and we can add antibiotic by end of week or next week if needed ? ? ?Please schedule a Follow-up Appointment to: Return if symptoms worsen or fail to improve. ? ?If you have any other questions or concerns, please feel free to call the office or send a message through [place]. You may also schedule an earlier appointment if necessary. ? ?Additionally, you may be receiving a survey about your experience at our office within a few days to 1 week by e-mail or mail. We value your feedback. ? ?[name], DO ?[place] ?
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
## 2022-03-16 DIAGNOSIS — M9902 Segmental and somatic dysfunction of thoracic region: Secondary | ICD-10-CM | POA: Diagnosis not present

## 2022-03-16 DIAGNOSIS — M542 Cervicalgia: Secondary | ICD-10-CM | POA: Diagnosis not present

## 2022-03-16 DIAGNOSIS — M9901 Segmental and somatic dysfunction of cervical region: Secondary | ICD-10-CM | POA: Diagnosis not present

## 2022-03-16 DIAGNOSIS — M47892 Other spondylosis, cervical region: Secondary | ICD-10-CM | POA: Diagnosis not present

## 2022-03-31 DIAGNOSIS — H43813 Vitreous degeneration, bilateral: Secondary | ICD-10-CM | POA: Diagnosis not present

## 2022-03-31 DIAGNOSIS — Z961 Presence of intraocular lens: Secondary | ICD-10-CM | POA: Diagnosis not present

## 2022-03-31 DIAGNOSIS — H02051 Trichiasis without entropian right upper eyelid: Secondary | ICD-10-CM | POA: Diagnosis not present

## 2022-03-31 DIAGNOSIS — H353222 Exudative age-related macular degeneration, left eye, with inactive choroidal neovascularization: Secondary | ICD-10-CM | POA: Diagnosis not present

## 2022-03-31 DIAGNOSIS — H02052 Trichiasis without entropian right lower eyelid: Secondary | ICD-10-CM | POA: Diagnosis not present

## 2022-03-31 DIAGNOSIS — H02055 Trichiasis without entropian left lower eyelid: Secondary | ICD-10-CM | POA: Diagnosis not present

## 2022-03-31 DIAGNOSIS — H401131 Primary open-angle glaucoma, bilateral, mild stage: Secondary | ICD-10-CM | POA: Diagnosis not present

## 2022-03-31 DIAGNOSIS — H353113 Nonexudative age-related macular degeneration, right eye, advanced atrophic without subfoveal involvement: Secondary | ICD-10-CM | POA: Diagnosis not present

## 2022-04-13 DIAGNOSIS — M47892 Other spondylosis, cervical region: Secondary | ICD-10-CM | POA: Diagnosis not present

## 2022-04-13 DIAGNOSIS — M542 Cervicalgia: Secondary | ICD-10-CM | POA: Diagnosis not present

## 2022-04-13 DIAGNOSIS — M9901 Segmental and somatic dysfunction of cervical region: Secondary | ICD-10-CM | POA: Diagnosis not present

## 2022-04-13 DIAGNOSIS — M9902 Segmental and somatic dysfunction of thoracic region: Secondary | ICD-10-CM | POA: Diagnosis not present

## 2022-04-23 ENCOUNTER — Telehealth: Payer: Self-pay

## 2022-04-23 NOTE — Telephone Encounter (Signed)
I called and spoke with Jason Craig, and she declined the visit at this time. She said they have a lot going on at this time and will need to put it off until a later date.

## 2022-05-11 DIAGNOSIS — M9901 Segmental and somatic dysfunction of cervical region: Secondary | ICD-10-CM | POA: Diagnosis not present

## 2022-05-11 DIAGNOSIS — M9902 Segmental and somatic dysfunction of thoracic region: Secondary | ICD-10-CM | POA: Diagnosis not present

## 2022-05-11 DIAGNOSIS — M542 Cervicalgia: Secondary | ICD-10-CM | POA: Diagnosis not present

## 2022-05-11 DIAGNOSIS — M47892 Other spondylosis, cervical region: Secondary | ICD-10-CM | POA: Diagnosis not present

## 2022-06-03 DIAGNOSIS — H353114 Nonexudative age-related macular degeneration, right eye, advanced atrophic with subfoveal involvement: Secondary | ICD-10-CM | POA: Diagnosis not present

## 2022-06-03 DIAGNOSIS — H353222 Exudative age-related macular degeneration, left eye, with inactive choroidal neovascularization: Secondary | ICD-10-CM | POA: Diagnosis not present

## 2022-06-03 DIAGNOSIS — H43813 Vitreous degeneration, bilateral: Secondary | ICD-10-CM | POA: Diagnosis not present

## 2022-06-03 DIAGNOSIS — H3540 Unspecified peripheral retinal degeneration: Secondary | ICD-10-CM | POA: Diagnosis not present

## 2022-06-08 DIAGNOSIS — M9901 Segmental and somatic dysfunction of cervical region: Secondary | ICD-10-CM | POA: Diagnosis not present

## 2022-06-08 DIAGNOSIS — M9902 Segmental and somatic dysfunction of thoracic region: Secondary | ICD-10-CM | POA: Diagnosis not present

## 2022-06-08 DIAGNOSIS — M47892 Other spondylosis, cervical region: Secondary | ICD-10-CM | POA: Diagnosis not present

## 2022-06-08 DIAGNOSIS — M542 Cervicalgia: Secondary | ICD-10-CM | POA: Diagnosis not present

## 2022-07-06 DIAGNOSIS — M542 Cervicalgia: Secondary | ICD-10-CM | POA: Diagnosis not present

## 2022-07-06 DIAGNOSIS — M9902 Segmental and somatic dysfunction of thoracic region: Secondary | ICD-10-CM | POA: Diagnosis not present

## 2022-07-06 DIAGNOSIS — M9901 Segmental and somatic dysfunction of cervical region: Secondary | ICD-10-CM | POA: Diagnosis not present

## 2022-07-06 DIAGNOSIS — M47892 Other spondylosis, cervical region: Secondary | ICD-10-CM | POA: Diagnosis not present

## 2022-07-20 DIAGNOSIS — Z79899 Other long term (current) drug therapy: Secondary | ICD-10-CM | POA: Diagnosis not present

## 2022-07-20 DIAGNOSIS — L400 Psoriasis vulgaris: Secondary | ICD-10-CM | POA: Diagnosis not present

## 2022-07-24 ENCOUNTER — Other Ambulatory Visit: Payer: Self-pay

## 2022-07-24 DIAGNOSIS — D508 Other iron deficiency anemias: Secondary | ICD-10-CM

## 2022-07-24 DIAGNOSIS — E782 Mixed hyperlipidemia: Secondary | ICD-10-CM

## 2022-07-24 DIAGNOSIS — I2510 Atherosclerotic heart disease of native coronary artery without angina pectoris: Secondary | ICD-10-CM

## 2022-07-24 DIAGNOSIS — R7989 Other specified abnormal findings of blood chemistry: Secondary | ICD-10-CM

## 2022-07-24 DIAGNOSIS — E538 Deficiency of other specified B group vitamins: Secondary | ICD-10-CM

## 2022-07-24 DIAGNOSIS — R7309 Other abnormal glucose: Secondary | ICD-10-CM

## 2022-07-24 DIAGNOSIS — Z0000 Encounter for general adult medical examination without abnormal findings: Secondary | ICD-10-CM

## 2022-07-24 DIAGNOSIS — R351 Nocturia: Secondary | ICD-10-CM

## 2022-07-24 DIAGNOSIS — I129 Hypertensive chronic kidney disease with stage 1 through stage 4 chronic kidney disease, or unspecified chronic kidney disease: Secondary | ICD-10-CM

## 2022-07-27 ENCOUNTER — Other Ambulatory Visit: Payer: Medicare Other

## 2022-07-27 DIAGNOSIS — E782 Mixed hyperlipidemia: Secondary | ICD-10-CM | POA: Diagnosis not present

## 2022-07-27 DIAGNOSIS — R7989 Other specified abnormal findings of blood chemistry: Secondary | ICD-10-CM | POA: Diagnosis not present

## 2022-07-27 DIAGNOSIS — R7309 Other abnormal glucose: Secondary | ICD-10-CM | POA: Diagnosis not present

## 2022-07-27 DIAGNOSIS — I129 Hypertensive chronic kidney disease with stage 1 through stage 4 chronic kidney disease, or unspecified chronic kidney disease: Secondary | ICD-10-CM | POA: Diagnosis not present

## 2022-07-28 LAB — CBC WITH DIFFERENTIAL/PLATELET
Absolute Monocytes: 605 cells/uL (ref 200–950)
Basophils Absolute: 61 cells/uL (ref 0–200)
Basophils Relative: 1.1 %
Eosinophils Absolute: 341 cells/uL (ref 15–500)
Eosinophils Relative: 6.2 %
HCT: 41.8 % (ref 38.5–50.0)
Hemoglobin: 14.3 g/dL (ref 13.2–17.1)
Lymphs Abs: 1590 cells/uL (ref 850–3900)
MCH: 31.7 pg (ref 27.0–33.0)
MCHC: 34.2 g/dL (ref 32.0–36.0)
MCV: 92.7 fL (ref 80.0–100.0)
MPV: 9.3 fL (ref 7.5–12.5)
Monocytes Relative: 11 %
Neutro Abs: 2904 cells/uL (ref 1500–7800)
Neutrophils Relative %: 52.8 %
Platelets: 234 10*3/uL (ref 140–400)
RBC: 4.51 10*6/uL (ref 4.20–5.80)
RDW: 12.4 % (ref 11.0–15.0)
Total Lymphocyte: 28.9 %
WBC: 5.5 10*3/uL (ref 3.8–10.8)

## 2022-07-28 LAB — TSH: TSH: 8.01 mIU/L — ABNORMAL HIGH (ref 0.40–4.50)

## 2022-07-28 LAB — COMPLETE METABOLIC PANEL WITH GFR
AG Ratio: 1.4 (calc) (ref 1.0–2.5)
ALT: 29 U/L (ref 9–46)
AST: 20 U/L (ref 10–35)
Albumin: 4.3 g/dL (ref 3.6–5.1)
Alkaline phosphatase (APISO): 76 U/L (ref 35–144)
BUN: 20 mg/dL (ref 7–25)
CO2: 26 mmol/L (ref 20–32)
Calcium: 9.4 mg/dL (ref 8.6–10.3)
Chloride: 106 mmol/L (ref 98–110)
Creat: 1.11 mg/dL (ref 0.70–1.22)
Globulin: 3 g/dL (calc) (ref 1.9–3.7)
Glucose, Bld: 122 mg/dL — ABNORMAL HIGH (ref 65–99)
Potassium: 4.8 mmol/L (ref 3.5–5.3)
Sodium: 141 mmol/L (ref 135–146)
Total Bilirubin: 0.5 mg/dL (ref 0.2–1.2)
Total Protein: 7.3 g/dL (ref 6.1–8.1)
eGFR: 66 mL/min/{1.73_m2} (ref >=60)

## 2022-07-28 LAB — LIPID PANEL
Cholesterol: 86 mg/dL (ref <200)
HDL: 44 mg/dL (ref >=40)
LDL Cholesterol (Calc): 23 mg/dL (calc)
Non-HDL Cholesterol (Calc): 42 mg/dL (calc) (ref <130)
Total CHOL/HDL Ratio: 2 (calc) (ref <5.0)
Triglycerides: 102 mg/dL (ref <150)

## 2022-07-28 LAB — VITAMIN B12: Vitamin B-12: 397 pg/mL (ref 200–1100)

## 2022-07-28 LAB — T4, FREE: Free T4: 1 ng/dL (ref 0.8–1.8)

## 2022-07-28 LAB — PSA: PSA: 1.58 ng/mL (ref <=4.00)

## 2022-07-28 LAB — HEMOGLOBIN A1C
Hgb A1c MFr Bld: 5.9 % of total Hgb — ABNORMAL HIGH (ref <5.7)
Mean Plasma Glucose: 123 mg/dL
eAG (mmol/L): 6.8 mmol/L

## 2022-07-30 DIAGNOSIS — H5203 Hypermetropia, bilateral: Secondary | ICD-10-CM | POA: Diagnosis not present

## 2022-07-30 DIAGNOSIS — Z961 Presence of intraocular lens: Secondary | ICD-10-CM | POA: Diagnosis not present

## 2022-07-30 DIAGNOSIS — H524 Presbyopia: Secondary | ICD-10-CM | POA: Diagnosis not present

## 2022-07-30 DIAGNOSIS — H02055 Trichiasis without entropian left lower eyelid: Secondary | ICD-10-CM | POA: Diagnosis not present

## 2022-07-30 DIAGNOSIS — H02052 Trichiasis without entropian right lower eyelid: Secondary | ICD-10-CM | POA: Diagnosis not present

## 2022-07-30 DIAGNOSIS — H353222 Exudative age-related macular degeneration, left eye, with inactive choroidal neovascularization: Secondary | ICD-10-CM | POA: Diagnosis not present

## 2022-07-30 DIAGNOSIS — H43813 Vitreous degeneration, bilateral: Secondary | ICD-10-CM | POA: Diagnosis not present

## 2022-07-30 DIAGNOSIS — H02051 Trichiasis without entropian right upper eyelid: Secondary | ICD-10-CM | POA: Diagnosis not present

## 2022-07-30 DIAGNOSIS — H353113 Nonexudative age-related macular degeneration, right eye, advanced atrophic without subfoveal involvement: Secondary | ICD-10-CM | POA: Diagnosis not present

## 2022-07-30 DIAGNOSIS — H401131 Primary open-angle glaucoma, bilateral, mild stage: Secondary | ICD-10-CM | POA: Diagnosis not present

## 2022-07-30 DIAGNOSIS — H52223 Regular astigmatism, bilateral: Secondary | ICD-10-CM | POA: Diagnosis not present

## 2022-08-03 ENCOUNTER — Ambulatory Visit (INDEPENDENT_AMBULATORY_CARE_PROVIDER_SITE_OTHER): Payer: Medicare Other | Admitting: Family Medicine

## 2022-08-03 ENCOUNTER — Encounter: Payer: Self-pay | Admitting: Family Medicine

## 2022-08-03 VITALS — BP 134/70 | HR 55 | Ht 71.0 in | Wt 201.6 lb

## 2022-08-03 DIAGNOSIS — R7989 Other specified abnormal findings of blood chemistry: Secondary | ICD-10-CM | POA: Diagnosis not present

## 2022-08-03 DIAGNOSIS — N182 Chronic kidney disease, stage 2 (mild): Secondary | ICD-10-CM | POA: Diagnosis not present

## 2022-08-03 DIAGNOSIS — E538 Deficiency of other specified B group vitamins: Secondary | ICD-10-CM | POA: Diagnosis not present

## 2022-08-03 DIAGNOSIS — E782 Mixed hyperlipidemia: Secondary | ICD-10-CM | POA: Diagnosis not present

## 2022-08-03 DIAGNOSIS — I129 Hypertensive chronic kidney disease with stage 1 through stage 4 chronic kidney disease, or unspecified chronic kidney disease: Secondary | ICD-10-CM

## 2022-08-03 DIAGNOSIS — Z0000 Encounter for general adult medical examination without abnormal findings: Secondary | ICD-10-CM

## 2022-08-03 DIAGNOSIS — I2510 Atherosclerotic heart disease of native coronary artery without angina pectoris: Secondary | ICD-10-CM

## 2022-08-03 DIAGNOSIS — Z23 Encounter for immunization: Secondary | ICD-10-CM

## 2022-08-03 DIAGNOSIS — Z6828 Body mass index (BMI) 28.0-28.9, adult: Secondary | ICD-10-CM

## 2022-08-03 NOTE — Assessment & Plan Note (Signed)
Stable, without angina. S/p PCI stent in 12/2016 after STEMI Followed by [facility], on med management ASA, BB, ARB, Statin OFF Plavix

## 2022-08-03 NOTE — Assessment & Plan Note (Signed)
Elevated TSH again up to 8, normal Free T4 Asymptomatic No fatigue Will continue to monitor lab and repeat thyroid testing yearly for now, no new rx medication at this time.

## 2022-08-03 NOTE — Patient Instructions (Addendum)
Thank you for coming to the office today.  BP mild elevated Repeat today  Flu Shot today  Labs look good overall.  Recent Labs    07/27/22 0813  HGBA1C 5.9*   Goal to limit excess carb starch sugar, to prevent increasing blood sugar  TSH level was higher again this time, only mild elevated 8 range. The T4 level was normal, This means that you do not yet have low thyroid. We can continue to monitor on blood work. And no treatment is required today   DUE for FASTING BLOOD WORK (no food or drink after midnight before the lab appointment, only water or coffee without cream/sugar on the morning of)  SCHEDULE "Lab Only" visit in the morning at the clinic for lab draw in 1 YEAR  - Make sure Lab Only appointment is at about 1 week before your next appointment, so that results will be available  For Lab Results, once available within 2-3 days of blood draw, you can can log in to MyChart online to view your results and a brief explanation. Also, we can discuss results at next follow-up visit.     Please schedule a Follow-up Appointment to: Return in about 1 year (around 08/04/2023) for 1 year fasting lab only then 1 week later Annual Physical.  If you have any other questions or concerns, please feel free to call the office or send a message through [place]. You may also schedule an earlier appointment if necessary.  Additionally, you may be receiving a survey about your experience at our office within a few days to 1 week by e-mail or mail. We value your feedback.  [name], DO [place]

## 2022-08-03 NOTE — Assessment & Plan Note (Signed)
Well-controlled HTN Significantly improved Creatinine still, around 1.1 - Home BP readings normal  Complication with CAD, CKD-II    Plan:  1. Continue current BP regimen Losartan '25mg'$  daily, Metoprolol '25mg'$  BID (half of '50mg'$ ) 2. Encourage improved lifestyle - low sodium diet, regular exercise - Improve hydration, reduce diet soda and inc water, avoid NSAIDs 3. Continue monitor BP outside office, bring readings to next visit, if persistently >140/90 or new symptoms notify office sooner
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
## 2022-08-03 NOTE — Assessment & Plan Note (Signed)
Controlled cholesterol on statin and lifestyle Last lipid panel 07/2022 Known CAD  Plan: 1. Continue current meds - Atorvastatin '40mg'$  daily per cardiology 2. Continue ASA '81mg'$  for secondary ASCVD risk reduction OFF Plavix 3. Encourage improved lifestyle - low carb/cholesterol, reduce portion size, continue improving regular exercise

## 2022-08-03 NOTE — Assessment & Plan Note (Signed)
Stable, resolved Vitamin B12 deficiency Continue oral Vitamin B12 1,000 daily

## 2022-08-05 ENCOUNTER — Encounter: Payer: Medicare Other | Admitting: Family Medicine

## 2022-08-17 DIAGNOSIS — M542 Cervicalgia: Secondary | ICD-10-CM | POA: Diagnosis not present

## 2022-08-17 DIAGNOSIS — M9902 Segmental and somatic dysfunction of thoracic region: Secondary | ICD-10-CM | POA: Diagnosis not present

## 2022-08-17 DIAGNOSIS — M47892 Other spondylosis, cervical region: Secondary | ICD-10-CM | POA: Diagnosis not present

## 2022-08-17 DIAGNOSIS — M9901 Segmental and somatic dysfunction of cervical region: Secondary | ICD-10-CM | POA: Diagnosis not present

## 2022-09-30 DIAGNOSIS — I25118 Atherosclerotic heart disease of native coronary artery with other forms of angina pectoris: Secondary | ICD-10-CM | POA: Diagnosis not present

## 2022-09-30 DIAGNOSIS — I2510 Atherosclerotic heart disease of native coronary artery without angina pectoris: Secondary | ICD-10-CM | POA: Diagnosis not present

## 2022-09-30 DIAGNOSIS — I2089 Other forms of angina pectoris: Secondary | ICD-10-CM | POA: Diagnosis not present

## 2022-09-30 DIAGNOSIS — Z955 Presence of coronary angioplasty implant and graft: Secondary | ICD-10-CM | POA: Diagnosis not present

## 2022-09-30 DIAGNOSIS — R001 Bradycardia, unspecified: Secondary | ICD-10-CM | POA: Diagnosis not present

## 2022-09-30 DIAGNOSIS — R011 Cardiac murmur, unspecified: Secondary | ICD-10-CM | POA: Diagnosis not present

## 2022-09-30 DIAGNOSIS — E785 Hyperlipidemia, unspecified: Secondary | ICD-10-CM | POA: Diagnosis not present

## 2022-09-30 DIAGNOSIS — Z87891 Personal history of nicotine dependence: Secondary | ICD-10-CM | POA: Diagnosis not present

## 2022-09-30 DIAGNOSIS — I213 ST elevation (STEMI) myocardial infarction of unspecified site: Secondary | ICD-10-CM | POA: Diagnosis not present

## 2022-09-30 DIAGNOSIS — I228 Subsequent ST elevation (STEMI) myocardial infarction of other sites: Secondary | ICD-10-CM | POA: Diagnosis not present

## 2022-10-23 ENCOUNTER — Telehealth: Payer: Self-pay | Admitting: Family Medicine

## 2022-10-23 NOTE — Telephone Encounter (Signed)
Left message for patient to call back and schedule Medicare Annual Wellness Visit (AWV) to be done virtually or by telephone.  No hx of AWV eligible as of 11/09/09  Please schedule at anytime with [facility].      72 Minutes appointment   Any questions, please call me at [phone]

## 2022-11-30 DIAGNOSIS — H43813 Vitreous degeneration, bilateral: Secondary | ICD-10-CM | POA: Diagnosis not present

## 2022-11-30 DIAGNOSIS — H401131 Primary open-angle glaucoma, bilateral, mild stage: Secondary | ICD-10-CM | POA: Diagnosis not present

## 2022-11-30 DIAGNOSIS — H02051 Trichiasis without entropian right upper eyelid: Secondary | ICD-10-CM | POA: Diagnosis not present

## 2022-11-30 DIAGNOSIS — H524 Presbyopia: Secondary | ICD-10-CM | POA: Diagnosis not present

## 2022-11-30 DIAGNOSIS — H02055 Trichiasis without entropian left lower eyelid: Secondary | ICD-10-CM | POA: Diagnosis not present

## 2022-11-30 DIAGNOSIS — H353113 Nonexudative age-related macular degeneration, right eye, advanced atrophic without subfoveal involvement: Secondary | ICD-10-CM | POA: Diagnosis not present

## 2022-11-30 DIAGNOSIS — H52223 Regular astigmatism, bilateral: Secondary | ICD-10-CM | POA: Diagnosis not present

## 2022-11-30 DIAGNOSIS — H02052 Trichiasis without entropian right lower eyelid: Secondary | ICD-10-CM | POA: Diagnosis not present

## 2022-11-30 DIAGNOSIS — H353222 Exudative age-related macular degeneration, left eye, with inactive choroidal neovascularization: Secondary | ICD-10-CM | POA: Diagnosis not present

## 2022-11-30 DIAGNOSIS — Z961 Presence of intraocular lens: Secondary | ICD-10-CM | POA: Diagnosis not present

## 2022-11-30 DIAGNOSIS — H5203 Hypermetropia, bilateral: Secondary | ICD-10-CM | POA: Diagnosis not present

## 2022-12-02 DIAGNOSIS — H04123 Dry eye syndrome of bilateral lacrimal glands: Secondary | ICD-10-CM | POA: Diagnosis not present

## 2022-12-02 DIAGNOSIS — H353222 Exudative age-related macular degeneration, left eye, with inactive choroidal neovascularization: Secondary | ICD-10-CM | POA: Diagnosis not present

## 2022-12-02 DIAGNOSIS — H353114 Nonexudative age-related macular degeneration, right eye, advanced atrophic with subfoveal involvement: Secondary | ICD-10-CM | POA: Diagnosis not present

## 2022-12-02 DIAGNOSIS — H26493 Other secondary cataract, bilateral: Secondary | ICD-10-CM | POA: Diagnosis not present

## 2022-12-02 DIAGNOSIS — H43813 Vitreous degeneration, bilateral: Secondary | ICD-10-CM | POA: Diagnosis not present

## 2023-01-18 DIAGNOSIS — L570 Actinic keratosis: Secondary | ICD-10-CM | POA: Diagnosis not present

## 2023-01-18 DIAGNOSIS — Z79899 Other long term (current) drug therapy: Secondary | ICD-10-CM | POA: Diagnosis not present

## 2023-01-18 DIAGNOSIS — L400 Psoriasis vulgaris: Secondary | ICD-10-CM | POA: Diagnosis not present

## 2023-02-03 DIAGNOSIS — L400 Psoriasis vulgaris: Secondary | ICD-10-CM | POA: Diagnosis not present

## 2023-02-03 DIAGNOSIS — Z7689 Persons encountering health services in other specified circumstances: Secondary | ICD-10-CM | POA: Diagnosis not present

## 2023-02-03 DIAGNOSIS — Z79899 Other long term (current) drug therapy: Secondary | ICD-10-CM | POA: Diagnosis not present

## 2023-03-03 DIAGNOSIS — H353222 Exudative age-related macular degeneration, left eye, with inactive choroidal neovascularization: Secondary | ICD-10-CM | POA: Diagnosis not present

## 2023-03-30 DIAGNOSIS — Z961 Presence of intraocular lens: Secondary | ICD-10-CM | POA: Diagnosis not present

## 2023-03-30 DIAGNOSIS — H401131 Primary open-angle glaucoma, bilateral, mild stage: Secondary | ICD-10-CM | POA: Diagnosis not present

## 2023-03-30 DIAGNOSIS — H353222 Exudative age-related macular degeneration, left eye, with inactive choroidal neovascularization: Secondary | ICD-10-CM | POA: Diagnosis not present

## 2023-03-30 DIAGNOSIS — H5203 Hypermetropia, bilateral: Secondary | ICD-10-CM | POA: Diagnosis not present

## 2023-03-30 DIAGNOSIS — H02055 Trichiasis without entropian left lower eyelid: Secondary | ICD-10-CM | POA: Diagnosis not present

## 2023-03-30 DIAGNOSIS — H43813 Vitreous degeneration, bilateral: Secondary | ICD-10-CM | POA: Diagnosis not present

## 2023-03-30 DIAGNOSIS — H353113 Nonexudative age-related macular degeneration, right eye, advanced atrophic without subfoveal involvement: Secondary | ICD-10-CM | POA: Diagnosis not present

## 2023-03-30 DIAGNOSIS — H02052 Trichiasis without entropian right lower eyelid: Secondary | ICD-10-CM | POA: Diagnosis not present

## 2023-03-30 DIAGNOSIS — H524 Presbyopia: Secondary | ICD-10-CM | POA: Diagnosis not present

## 2023-03-30 DIAGNOSIS — H52223 Regular astigmatism, bilateral: Secondary | ICD-10-CM | POA: Diagnosis not present

## 2023-03-30 DIAGNOSIS — H02051 Trichiasis without entropian right upper eyelid: Secondary | ICD-10-CM | POA: Diagnosis not present

## 2023-05-10 DIAGNOSIS — H02052 Trichiasis without entropian right lower eyelid: Secondary | ICD-10-CM | POA: Diagnosis not present

## 2023-05-10 DIAGNOSIS — H353222 Exudative age-related macular degeneration, left eye, with inactive choroidal neovascularization: Secondary | ICD-10-CM | POA: Diagnosis not present

## 2023-05-10 DIAGNOSIS — H353113 Nonexudative age-related macular degeneration, right eye, advanced atrophic without subfoveal involvement: Secondary | ICD-10-CM | POA: Diagnosis not present

## 2023-05-10 DIAGNOSIS — H5203 Hypermetropia, bilateral: Secondary | ICD-10-CM | POA: Diagnosis not present

## 2023-05-10 DIAGNOSIS — H43813 Vitreous degeneration, bilateral: Secondary | ICD-10-CM | POA: Diagnosis not present

## 2023-05-10 DIAGNOSIS — H02055 Trichiasis without entropian left lower eyelid: Secondary | ICD-10-CM | POA: Diagnosis not present

## 2023-05-10 DIAGNOSIS — H524 Presbyopia: Secondary | ICD-10-CM | POA: Diagnosis not present

## 2023-05-10 DIAGNOSIS — Z961 Presence of intraocular lens: Secondary | ICD-10-CM | POA: Diagnosis not present

## 2023-05-10 DIAGNOSIS — H02051 Trichiasis without entropian right upper eyelid: Secondary | ICD-10-CM | POA: Diagnosis not present

## 2023-05-10 DIAGNOSIS — H52223 Regular astigmatism, bilateral: Secondary | ICD-10-CM | POA: Diagnosis not present

## 2023-05-10 DIAGNOSIS — H401131 Primary open-angle glaucoma, bilateral, mild stage: Secondary | ICD-10-CM | POA: Diagnosis not present

## 2023-06-02 DIAGNOSIS — H43813 Vitreous degeneration, bilateral: Secondary | ICD-10-CM | POA: Diagnosis not present

## 2023-06-02 DIAGNOSIS — H353114 Nonexudative age-related macular degeneration, right eye, advanced atrophic with subfoveal involvement: Secondary | ICD-10-CM | POA: Diagnosis not present

## 2023-06-30 ENCOUNTER — Encounter: Payer: Self-pay | Admitting: Podiatry

## 2023-06-30 ENCOUNTER — Ambulatory Visit: Payer: Medicare Other | Admitting: Podiatry

## 2023-06-30 DIAGNOSIS — M21611 Bunion of right foot: Secondary | ICD-10-CM

## 2023-06-30 DIAGNOSIS — M2012 Hallux valgus (acquired), left foot: Secondary | ICD-10-CM | POA: Diagnosis not present

## 2023-06-30 DIAGNOSIS — M2011 Hallux valgus (acquired), right foot: Secondary | ICD-10-CM

## 2023-06-30 DIAGNOSIS — M21612 Bunion of left foot: Secondary | ICD-10-CM | POA: Diagnosis not present

## 2023-06-30 NOTE — Progress Notes (Signed)
  Subjective:  Patient ID: Jason Craig, male    DOB: 09-20-1941,  MRN: [id number]  Chief Complaint  Patient presents with   Toe Pain    "I got a place on my left foot on the side of my toe that's giving me some trouble." N - blister L - bunion left D - 1 week O - suddenly, gotten better C - blister, sometime sore A - I wore a pair of tennis shoes with no socks T - none    82 y.o. male presents with the above complaint. History confirmed with patient.   Objective:  Physical Exam: warm, good capillary refill, no trophic changes or ulcerative lesions, normal DP and PT pulses, normal sensory exam, and bilateral hallux valgus, healing blister on left bunion.  Assessment:   1. Hallux valgus with bunions, left   2. Hallux valgus with bunions, right      Plan:  Patient was evaluated and treated and all questions answered.  We discussed etiology and treatment options of hallux valgus and bunion deformity.  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  We discussed appropriate shoe gear that can offload and he says that he knows what shoes caused it when he wore them without socks in the yard.  I recommended offloading with a silicone bunion pad.  These were dispensed for him.  Return as needed if it worsens, silver bunionectomy may offer some relief  No follow-ups on file.

## 2023-07-21 DIAGNOSIS — L400 Psoriasis vulgaris: Secondary | ICD-10-CM | POA: Diagnosis not present

## 2023-07-21 DIAGNOSIS — L570 Actinic keratosis: Secondary | ICD-10-CM | POA: Diagnosis not present

## 2023-07-21 DIAGNOSIS — Z79899 Other long term (current) drug therapy: Secondary | ICD-10-CM | POA: Diagnosis not present

## 2023-07-30 ENCOUNTER — Other Ambulatory Visit: Payer: Self-pay

## 2023-07-30 DIAGNOSIS — R7309 Other abnormal glucose: Secondary | ICD-10-CM

## 2023-07-30 DIAGNOSIS — Z0000 Encounter for general adult medical examination without abnormal findings: Secondary | ICD-10-CM

## 2023-07-30 DIAGNOSIS — R7989 Other specified abnormal findings of blood chemistry: Secondary | ICD-10-CM

## 2023-07-30 DIAGNOSIS — I129 Hypertensive chronic kidney disease with stage 1 through stage 4 chronic kidney disease, or unspecified chronic kidney disease: Secondary | ICD-10-CM

## 2023-07-30 DIAGNOSIS — E782 Mixed hyperlipidemia: Secondary | ICD-10-CM

## 2023-07-30 DIAGNOSIS — Z125 Encounter for screening for malignant neoplasm of prostate: Secondary | ICD-10-CM

## 2023-08-02 ENCOUNTER — Other Ambulatory Visit: Payer: Medicare Other

## 2023-08-02 DIAGNOSIS — R7309 Other abnormal glucose: Secondary | ICD-10-CM | POA: Diagnosis not present

## 2023-08-02 DIAGNOSIS — E782 Mixed hyperlipidemia: Secondary | ICD-10-CM | POA: Diagnosis not present

## 2023-08-02 DIAGNOSIS — Z0000 Encounter for general adult medical examination without abnormal findings: Secondary | ICD-10-CM | POA: Diagnosis not present

## 2023-08-02 DIAGNOSIS — R7989 Other specified abnormal findings of blood chemistry: Secondary | ICD-10-CM | POA: Diagnosis not present

## 2023-08-03 LAB — COMPREHENSIVE METABOLIC PANEL
AG Ratio: 1.4 (calc) (ref 1.0–2.5)
ALT: 32 U/L (ref 9–46)
AST: 21 U/L (ref 10–35)
Albumin: 4.2 g/dL (ref 3.6–5.1)
Alkaline phosphatase (APISO): 78 U/L (ref 35–144)
BUN: 23 mg/dL (ref 7–25)
CO2: 26 mmol/L (ref 20–32)
Calcium: 9.3 mg/dL (ref 8.6–10.3)
Chloride: 105 mmol/L (ref 98–110)
Creat: 1.03 mg/dL (ref 0.70–1.22)
Globulin: 3.1 g/dL (calc) (ref 1.9–3.7)
Glucose, Bld: 121 mg/dL — ABNORMAL HIGH (ref 65–99)
Potassium: 4.6 mmol/L (ref 3.5–5.3)
Sodium: 139 mmol/L (ref 135–146)
Total Bilirubin: 1 mg/dL (ref 0.2–1.2)
Total Protein: 7.3 g/dL (ref 6.1–8.1)

## 2023-08-03 LAB — HEMOGLOBIN A1C
Hgb A1c MFr Bld: 6.4 % of total Hgb — ABNORMAL HIGH (ref <5.7)
Mean Plasma Glucose: 137 mg/dL
eAG (mmol/L): 7.6 mmol/L

## 2023-08-03 LAB — CBC WITH DIFFERENTIAL/PLATELET
Absolute Monocytes: 628 cells/uL (ref 200–950)
Basophils Absolute: 43 cells/uL (ref 0–200)
Basophils Relative: 0.7 %
Eosinophils Absolute: 348 cells/uL (ref 15–500)
Eosinophils Relative: 5.7 %
HCT: 42.2 % (ref 38.5–50.0)
Hemoglobin: 14.3 g/dL (ref 13.2–17.1)
Lymphs Abs: 1678 cells/uL (ref 850–3900)
MCH: 31.4 pg (ref 27.0–33.0)
MCHC: 33.9 g/dL (ref 32.0–36.0)
MCV: 92.7 fL (ref 80.0–100.0)
MPV: 9.5 fL (ref 7.5–12.5)
Monocytes Relative: 10.3 %
Neutro Abs: 3404 cells/uL (ref 1500–7800)
Neutrophils Relative %: 55.8 %
Platelets: 238 10*3/uL (ref 140–400)
RBC: 4.55 10*6/uL (ref 4.20–5.80)
RDW: 12.8 % (ref 11.0–15.0)
Total Lymphocyte: 27.5 %
WBC: 6.1 10*3/uL (ref 3.8–10.8)

## 2023-08-03 LAB — LIPID PANEL
Cholesterol: 87 mg/dL (ref <200)
HDL: 40 mg/dL (ref >=40)
LDL Cholesterol (Calc): 26 mg/dL (calc)
Non-HDL Cholesterol (Calc): 47 mg/dL (calc) (ref <130)
Total CHOL/HDL Ratio: 2.2 (calc) (ref <5.0)
Triglycerides: 127 mg/dL (ref <150)

## 2023-08-03 LAB — TSH: TSH: 7.91 mIU/L — ABNORMAL HIGH (ref 0.40–4.50)

## 2023-08-03 LAB — PSA: PSA: 1.78 ng/mL (ref <=4.00)

## 2023-08-06 DIAGNOSIS — H5203 Hypermetropia, bilateral: Secondary | ICD-10-CM | POA: Diagnosis not present

## 2023-08-06 DIAGNOSIS — H52223 Regular astigmatism, bilateral: Secondary | ICD-10-CM | POA: Diagnosis not present

## 2023-08-06 DIAGNOSIS — H524 Presbyopia: Secondary | ICD-10-CM | POA: Diagnosis not present

## 2023-08-06 DIAGNOSIS — H43813 Vitreous degeneration, bilateral: Secondary | ICD-10-CM | POA: Diagnosis not present

## 2023-08-06 DIAGNOSIS — H02055 Trichiasis without entropian left lower eyelid: Secondary | ICD-10-CM | POA: Diagnosis not present

## 2023-08-06 DIAGNOSIS — H02051 Trichiasis without entropian right upper eyelid: Secondary | ICD-10-CM | POA: Diagnosis not present

## 2023-08-06 DIAGNOSIS — H401131 Primary open-angle glaucoma, bilateral, mild stage: Secondary | ICD-10-CM | POA: Diagnosis not present

## 2023-08-06 DIAGNOSIS — H353113 Nonexudative age-related macular degeneration, right eye, advanced atrophic without subfoveal involvement: Secondary | ICD-10-CM | POA: Diagnosis not present

## 2023-08-06 DIAGNOSIS — H353222 Exudative age-related macular degeneration, left eye, with inactive choroidal neovascularization: Secondary | ICD-10-CM | POA: Diagnosis not present

## 2023-08-06 DIAGNOSIS — H02052 Trichiasis without entropian right lower eyelid: Secondary | ICD-10-CM | POA: Diagnosis not present

## 2023-08-06 DIAGNOSIS — Z961 Presence of intraocular lens: Secondary | ICD-10-CM | POA: Diagnosis not present

## 2023-08-09 ENCOUNTER — Other Ambulatory Visit: Payer: Self-pay | Admitting: Family Medicine

## 2023-08-09 ENCOUNTER — Encounter: Payer: Self-pay | Admitting: Family Medicine

## 2023-08-09 ENCOUNTER — Ambulatory Visit (INDEPENDENT_AMBULATORY_CARE_PROVIDER_SITE_OTHER): Payer: Medicare Other | Admitting: Family Medicine

## 2023-08-09 VITALS — BP 138/80 | HR 60 | Ht 70.25 in | Wt 204.0 lb

## 2023-08-09 DIAGNOSIS — R351 Nocturia: Secondary | ICD-10-CM

## 2023-08-09 DIAGNOSIS — Z0000 Encounter for general adult medical examination without abnormal findings: Secondary | ICD-10-CM | POA: Diagnosis not present

## 2023-08-09 DIAGNOSIS — R7309 Other abnormal glucose: Secondary | ICD-10-CM

## 2023-08-09 DIAGNOSIS — I129 Hypertensive chronic kidney disease with stage 1 through stage 4 chronic kidney disease, or unspecified chronic kidney disease: Secondary | ICD-10-CM | POA: Diagnosis not present

## 2023-08-09 DIAGNOSIS — E538 Deficiency of other specified B group vitamins: Secondary | ICD-10-CM

## 2023-08-09 DIAGNOSIS — E782 Mixed hyperlipidemia: Secondary | ICD-10-CM

## 2023-08-09 DIAGNOSIS — R7989 Other specified abnormal findings of blood chemistry: Secondary | ICD-10-CM | POA: Diagnosis not present

## 2023-08-09 DIAGNOSIS — Z23 Encounter for immunization: Secondary | ICD-10-CM

## 2023-08-09 DIAGNOSIS — N182 Chronic kidney disease, stage 2 (mild): Secondary | ICD-10-CM
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
## 2023-08-09 NOTE — Patient Instructions (Signed)
Thank you for coming to the office today.  Flu Shot today  Elevated blood sugar Goal to limit excess sweets and carbs starches  Recent Labs    08/02/23 0806  HGBA1C 6.4*   Cholesterol   Lipid Panel     Component Value Date/Time   CHOL 87 08/02/2023 0806   TRIG 127 08/02/2023 0806   HDL 40 08/02/2023 0806   CHOLHDL 2.2 08/02/2023 0806   VLDL 15 02/25/2017 0001   LDLCALC 26 08/02/2023 0806     Chemistry      Component Value Date/Time   NA 139 08/02/2023 0806   K 4.6 08/02/2023 0806   CL 105 08/02/2023 0806   CO2 26 08/02/2023 0806   BUN 23 08/02/2023 0806   CREATININE 1.03 08/02/2023 0806      Component Value Date/Time   CALCIUM 9.3 08/02/2023 0806   ALKPHOS 71 02/25/2017 0001   AST 21 08/02/2023 0806   ALT 32 08/02/2023 0806   BILITOT 1.0 08/02/2023 0806       Please schedule a Follow-up Appointment to: No follow-ups on file.  If you have any other questions or concerns, please feel free to call the office or send a message through MyChart. You may also schedule an earlier appointment if necessary.  Additionally, you may be receiving a survey about your experience at our office within a few days to 1 week by e-mail or mail. We value your feedback.  Saralyn Pilar, DO [facility], [place]

## 2023-08-24 DIAGNOSIS — H353134 Nonexudative age-related macular degeneration, bilateral, advanced atrophic with subfoveal involvement: Secondary | ICD-10-CM | POA: Diagnosis not present

## 2023-09-30 DIAGNOSIS — I25118 Atherosclerotic heart disease of native coronary artery with other forms of angina pectoris: Secondary | ICD-10-CM | POA: Diagnosis not present

## 2023-09-30 DIAGNOSIS — I2089 Other forms of angina pectoris: Secondary | ICD-10-CM | POA: Diagnosis not present

## 2023-09-30 DIAGNOSIS — E785 Hyperlipidemia, unspecified: Secondary | ICD-10-CM | POA: Diagnosis not present

## 2023-09-30 DIAGNOSIS — I213 ST elevation (STEMI) myocardial infarction of unspecified site: Secondary | ICD-10-CM | POA: Diagnosis not present

## 2023-09-30 DIAGNOSIS — I228 Subsequent ST elevation (STEMI) myocardial infarction of other sites: Secondary | ICD-10-CM | POA: Diagnosis not present

## 2023-09-30 DIAGNOSIS — I10 Essential (primary) hypertension: Secondary | ICD-10-CM | POA: Diagnosis not present

## 2023-09-30 DIAGNOSIS — Z87891 Personal history of nicotine dependence: Secondary | ICD-10-CM | POA: Diagnosis not present

## 2023-09-30 DIAGNOSIS — I2510 Atherosclerotic heart disease of native coronary artery without angina pectoris: Secondary | ICD-10-CM | POA: Diagnosis not present

## 2023-09-30 DIAGNOSIS — R001 Bradycardia, unspecified: Secondary | ICD-10-CM | POA: Diagnosis not present

## 2023-09-30 DIAGNOSIS — Z955 Presence of coronary angioplasty implant and graft: Secondary | ICD-10-CM | POA: Diagnosis not present

## 2023-09-30 DIAGNOSIS — R011 Cardiac murmur, unspecified: Secondary | ICD-10-CM | POA: Diagnosis not present

## 2023-10-21 DIAGNOSIS — I2510 Atherosclerotic heart disease of native coronary artery without angina pectoris: Secondary | ICD-10-CM | POA: Diagnosis not present

## 2023-10-21 DIAGNOSIS — R011 Cardiac murmur, unspecified: Secondary | ICD-10-CM | POA: Diagnosis not present

## 2023-10-29 DIAGNOSIS — R011 Cardiac murmur, unspecified: Secondary | ICD-10-CM | POA: Diagnosis not present

## 2023-10-29 DIAGNOSIS — R001 Bradycardia, unspecified: Secondary | ICD-10-CM | POA: Diagnosis not present

## 2023-10-29 DIAGNOSIS — Z87891 Personal history of nicotine dependence: Secondary | ICD-10-CM | POA: Diagnosis not present

## 2023-10-29 DIAGNOSIS — Z955 Presence of coronary angioplasty implant and graft: Secondary | ICD-10-CM | POA: Diagnosis not present

## 2023-10-29 DIAGNOSIS — I25118 Atherosclerotic heart disease of native coronary artery with other forms of angina pectoris: Secondary | ICD-10-CM | POA: Diagnosis not present

## 2023-10-29 DIAGNOSIS — E785 Hyperlipidemia, unspecified: Secondary | ICD-10-CM | POA: Diagnosis not present

## 2023-10-29 DIAGNOSIS — I10 Essential (primary) hypertension: Secondary | ICD-10-CM | POA: Diagnosis not present

## 2023-10-29 DIAGNOSIS — I2510 Atherosclerotic heart disease of native coronary artery without angina pectoris: Secondary | ICD-10-CM | POA: Diagnosis not present

## 2023-10-29 DIAGNOSIS — I213 ST elevation (STEMI) myocardial infarction of unspecified site: Secondary | ICD-10-CM | POA: Diagnosis not present

## 2023-10-29 DIAGNOSIS — I2089 Other forms of angina pectoris: Secondary | ICD-10-CM | POA: Diagnosis not present

## 2023-10-29 DIAGNOSIS — I228 Subsequent ST elevation (STEMI) myocardial infarction of other sites: Secondary | ICD-10-CM | POA: Diagnosis not present

## 2023-11-17 DIAGNOSIS — H02059 Trichiasis without entropian unspecified eye, unspecified eyelid: Secondary | ICD-10-CM | POA: Diagnosis not present

## 2023-11-17 DIAGNOSIS — H401131 Primary open-angle glaucoma, bilateral, mild stage: Secondary | ICD-10-CM | POA: Diagnosis not present

## 2023-12-07 DIAGNOSIS — H353114 Nonexudative age-related macular degeneration, right eye, advanced atrophic with subfoveal involvement: Secondary | ICD-10-CM | POA: Diagnosis not present

## 2023-12-07 DIAGNOSIS — H43813 Vitreous degeneration, bilateral: Secondary | ICD-10-CM | POA: Diagnosis not present

## 2023-12-07 DIAGNOSIS — H26493 Other secondary cataract, bilateral: Secondary | ICD-10-CM | POA: Diagnosis not present

## 2023-12-07 DIAGNOSIS — H353222 Exudative age-related macular degeneration, left eye, with inactive choroidal neovascularization: Secondary | ICD-10-CM | POA: Diagnosis not present

## 2023-12-07 DIAGNOSIS — H04123 Dry eye syndrome of bilateral lacrimal glands: Secondary | ICD-10-CM | POA: Diagnosis not present

## 2024-01-05 DIAGNOSIS — H401131 Primary open-angle glaucoma, bilateral, mild stage: Secondary | ICD-10-CM | POA: Diagnosis not present

## 2024-01-05 DIAGNOSIS — H02059 Trichiasis without entropian unspecified eye, unspecified eyelid: Secondary | ICD-10-CM | POA: Diagnosis not present

## 2024-01-05 DIAGNOSIS — H353222 Exudative age-related macular degeneration, left eye, with inactive choroidal neovascularization: Secondary | ICD-10-CM | POA: Diagnosis not present

## 2024-01-05 DIAGNOSIS — H353113 Nonexudative age-related macular degeneration, right eye, advanced atrophic without subfoveal involvement: Secondary | ICD-10-CM | POA: Diagnosis not present

## 2024-01-07 DIAGNOSIS — H04123 Dry eye syndrome of bilateral lacrimal glands: Secondary | ICD-10-CM | POA: Diagnosis not present

## 2024-01-07 DIAGNOSIS — H353114 Nonexudative age-related macular degeneration, right eye, advanced atrophic with subfoveal involvement: Secondary | ICD-10-CM | POA: Diagnosis not present

## 2024-01-07 DIAGNOSIS — H26493 Other secondary cataract, bilateral: Secondary | ICD-10-CM | POA: Diagnosis not present

## 2024-01-07 DIAGNOSIS — H43813 Vitreous degeneration, bilateral: Secondary | ICD-10-CM | POA: Diagnosis not present

## 2024-01-07 DIAGNOSIS — H353222 Exudative age-related macular degeneration, left eye, with inactive choroidal neovascularization: Secondary | ICD-10-CM | POA: Diagnosis not present

## 2024-02-04 DIAGNOSIS — H43813 Vitreous degeneration, bilateral: Secondary | ICD-10-CM | POA: Diagnosis not present

## 2024-02-04 DIAGNOSIS — H26493 Other secondary cataract, bilateral: Secondary | ICD-10-CM | POA: Diagnosis not present

## 2024-02-04 DIAGNOSIS — H04123 Dry eye syndrome of bilateral lacrimal glands: Secondary | ICD-10-CM | POA: Diagnosis not present

## 2024-02-04 DIAGNOSIS — H353222 Exudative age-related macular degeneration, left eye, with inactive choroidal neovascularization: Secondary | ICD-10-CM | POA: Diagnosis not present

## 2024-02-04 DIAGNOSIS — H353114 Nonexudative age-related macular degeneration, right eye, advanced atrophic with subfoveal involvement: Secondary | ICD-10-CM | POA: Diagnosis not present

## 2024-02-07 DIAGNOSIS — Z79899 Other long term (current) drug therapy: Secondary | ICD-10-CM | POA: Diagnosis not present

## 2024-02-07 DIAGNOSIS — L400 Psoriasis vulgaris: Secondary | ICD-10-CM | POA: Diagnosis not present

## 2024-02-23 DIAGNOSIS — H353113 Nonexudative age-related macular degeneration, right eye, advanced atrophic without subfoveal involvement: Secondary | ICD-10-CM | POA: Diagnosis not present

## 2024-02-23 DIAGNOSIS — H353222 Exudative age-related macular degeneration, left eye, with inactive choroidal neovascularization: Secondary | ICD-10-CM | POA: Diagnosis not present

## 2024-02-23 DIAGNOSIS — H02052 Trichiasis without entropian right lower eyelid: Secondary | ICD-10-CM | POA: Diagnosis not present

## 2024-02-23 DIAGNOSIS — H02051 Trichiasis without entropian right upper eyelid: Secondary | ICD-10-CM | POA: Diagnosis not present

## 2024-02-23 DIAGNOSIS — H401131 Primary open-angle glaucoma, bilateral, mild stage: Secondary | ICD-10-CM | POA: Diagnosis not present

## 2024-03-07 DIAGNOSIS — H353222 Exudative age-related macular degeneration, left eye, with inactive choroidal neovascularization: Secondary | ICD-10-CM | POA: Diagnosis not present

## 2024-03-07 DIAGNOSIS — H26493 Other secondary cataract, bilateral: Secondary | ICD-10-CM | POA: Diagnosis not present

## 2024-03-07 DIAGNOSIS — H353114 Nonexudative age-related macular degeneration, right eye, advanced atrophic with subfoveal involvement: Secondary | ICD-10-CM | POA: Diagnosis not present

## 2024-03-07 DIAGNOSIS — H04123 Dry eye syndrome of bilateral lacrimal glands: Secondary | ICD-10-CM | POA: Diagnosis not present

## 2024-03-07 DIAGNOSIS — H43813 Vitreous degeneration, bilateral: Secondary | ICD-10-CM | POA: Diagnosis not present

## 2024-04-18 DIAGNOSIS — H04123 Dry eye syndrome of bilateral lacrimal glands: Secondary | ICD-10-CM | POA: Diagnosis not present

## 2024-04-18 DIAGNOSIS — H43813 Vitreous degeneration, bilateral: Secondary | ICD-10-CM | POA: Diagnosis not present

## 2024-04-18 DIAGNOSIS — H353221 Exudative age-related macular degeneration, left eye, with active choroidal neovascularization: Secondary | ICD-10-CM | POA: Diagnosis not present

## 2024-04-18 DIAGNOSIS — H353134 Nonexudative age-related macular degeneration, bilateral, advanced atrophic with subfoveal involvement: Secondary | ICD-10-CM | POA: Diagnosis not present

## 2024-04-18 DIAGNOSIS — H26493 Other secondary cataract, bilateral: Secondary | ICD-10-CM | POA: Diagnosis not present

## 2024-05-24 DIAGNOSIS — H401131 Primary open-angle glaucoma, bilateral, mild stage: Secondary | ICD-10-CM | POA: Diagnosis not present

## 2024-05-24 DIAGNOSIS — H02059 Trichiasis without entropian unspecified eye, unspecified eyelid: Secondary | ICD-10-CM | POA: Diagnosis not present

## 2024-05-24 DIAGNOSIS — H02052 Trichiasis without entropian right lower eyelid: Secondary | ICD-10-CM | POA: Diagnosis not present

## 2024-05-24 DIAGNOSIS — H353222 Exudative age-related macular degeneration, left eye, with inactive choroidal neovascularization: Secondary | ICD-10-CM | POA: Diagnosis not present

## 2024-05-24 DIAGNOSIS — H353113 Nonexudative age-related macular degeneration, right eye, advanced atrophic without subfoveal involvement: Secondary | ICD-10-CM | POA: Diagnosis not present

## 2024-05-24 DIAGNOSIS — H02051 Trichiasis without entropian right upper eyelid: Secondary | ICD-10-CM | POA: Diagnosis not present

## 2024-06-07 DIAGNOSIS — H353221 Exudative age-related macular degeneration, left eye, with active choroidal neovascularization: Secondary | ICD-10-CM | POA: Diagnosis not present

## 2024-06-07 DIAGNOSIS — H43813 Vitreous degeneration, bilateral: Secondary | ICD-10-CM | POA: Diagnosis not present

## 2024-06-07 DIAGNOSIS — H26493 Other secondary cataract, bilateral: Secondary | ICD-10-CM | POA: Diagnosis not present

## 2024-06-07 DIAGNOSIS — H04123 Dry eye syndrome of bilateral lacrimal glands: Secondary | ICD-10-CM | POA: Diagnosis not present

## 2024-06-07 DIAGNOSIS — H353134 Nonexudative age-related macular degeneration, bilateral, advanced atrophic with subfoveal involvement: Secondary | ICD-10-CM | POA: Diagnosis not present

## 2024-07-31 DIAGNOSIS — Z79620 Long term (current) use of immunosuppressive biologic: Secondary | ICD-10-CM | POA: Diagnosis not present

## 2024-07-31 DIAGNOSIS — L400 Psoriasis vulgaris: Secondary | ICD-10-CM | POA: Diagnosis not present

## 2024-08-02 DIAGNOSIS — H04123 Dry eye syndrome of bilateral lacrimal glands: Secondary | ICD-10-CM | POA: Diagnosis not present

## 2024-08-02 DIAGNOSIS — H353221 Exudative age-related macular degeneration, left eye, with active choroidal neovascularization: Secondary | ICD-10-CM | POA: Diagnosis not present

## 2024-08-02 DIAGNOSIS — H43813 Vitreous degeneration, bilateral: Secondary | ICD-10-CM | POA: Diagnosis not present

## 2024-08-02 DIAGNOSIS — H26493 Other secondary cataract, bilateral: Secondary | ICD-10-CM | POA: Diagnosis not present

## 2024-08-02 DIAGNOSIS — H353134 Nonexudative age-related macular degeneration, bilateral, advanced atrophic with subfoveal involvement: Secondary | ICD-10-CM | POA: Diagnosis not present

## 2024-08-08 ENCOUNTER — Other Ambulatory Visit: Payer: Self-pay

## 2024-08-08 DIAGNOSIS — Z79899 Other long term (current) drug therapy: Secondary | ICD-10-CM | POA: Diagnosis not present

## 2024-08-08 DIAGNOSIS — R7309 Other abnormal glucose: Secondary | ICD-10-CM | POA: Diagnosis not present

## 2024-08-08 DIAGNOSIS — R351 Nocturia: Secondary | ICD-10-CM

## 2024-08-08 DIAGNOSIS — R7989 Other specified abnormal findings of blood chemistry: Secondary | ICD-10-CM | POA: Diagnosis not present

## 2024-08-08 DIAGNOSIS — E782 Mixed hyperlipidemia: Secondary | ICD-10-CM | POA: Diagnosis not present

## 2024-08-08 DIAGNOSIS — E538 Deficiency of other specified B group vitamins: Secondary | ICD-10-CM

## 2024-08-08 DIAGNOSIS — Z0000 Encounter for general adult medical examination without abnormal findings: Secondary | ICD-10-CM

## 2024-08-08 DIAGNOSIS — I129 Hypertensive chronic kidney disease with stage 1 through stage 4 chronic kidney disease, or unspecified chronic kidney disease: Secondary | ICD-10-CM

## 2024-08-10 LAB — TSH: TSH: 8.41 m[IU]/L — ABNORMAL HIGH (ref 0.40–4.50)

## 2024-08-10 LAB — CBC WITH DIFFERENTIAL/PLATELET
Absolute Lymphocytes: 1548 {cells}/uL (ref 850–3900)
Absolute Monocytes: 594 {cells}/uL (ref 200–950)
Basophils Absolute: 60 {cells}/uL (ref 0–200)
Basophils Relative: 1 %
Eosinophils Absolute: 342 {cells}/uL (ref 15–500)
Eosinophils Relative: 5.7 %
HCT: 42.7 % (ref 38.5–50.0)
Hemoglobin: 14.2 g/dL (ref 13.2–17.1)
MCH: 31.3 pg (ref 27.0–33.0)
MCHC: 33.3 g/dL (ref 32.0–36.0)
MCV: 94.1 fL (ref 80.0–100.0)
MPV: 9.6 fL (ref 7.5–12.5)
Monocytes Relative: 9.9 %
Neutro Abs: 3456 {cells}/uL (ref 1500–7800)
Neutrophils Relative %: 57.6 %
Platelets: 235 Thousand/uL (ref 140–400)
RBC: 4.54 Million/uL (ref 4.20–5.80)
RDW: 12.6 % (ref 11.0–15.0)
Total Lymphocyte: 25.8 %
WBC: 6 Thousand/uL (ref 3.8–10.8)

## 2024-08-10 LAB — COMPLETE METABOLIC PANEL WITHOUT GFR
AG Ratio: 1.3 (calc) (ref 1.0–2.5)
ALT: 21 U/L (ref 9–46)
AST: 18 U/L (ref 10–35)
Albumin: 4.1 g/dL (ref 3.6–5.1)
Alkaline phosphatase (APISO): 73 U/L (ref 35–144)
BUN/Creatinine Ratio: 18 (calc) (ref 6–22)
BUN: 23 mg/dL (ref 7–25)
CO2: 29 mmol/L (ref 20–32)
Calcium: 9.5 mg/dL (ref 8.6–10.3)
Chloride: 103 mmol/L (ref 98–110)
Creat: 1.25 mg/dL — ABNORMAL HIGH (ref 0.70–1.22)
Globulin: 3.1 g/dL (ref 1.9–3.7)
Glucose, Bld: 128 mg/dL — ABNORMAL HIGH (ref 65–99)
Potassium: 4.7 mmol/L (ref 3.5–5.3)
Sodium: 140 mmol/L (ref 135–146)
Total Bilirubin: 0.7 mg/dL (ref 0.2–1.2)
Total Protein: 7.2 g/dL (ref 6.1–8.1)

## 2024-08-10 LAB — QUANTIFERON-TB GOLD PLUS
Mitogen-NIL: >10 [IU]/mL
NIL: 0.02 [IU]/mL
QuantiFERON-TB Gold Plus: NEGATIVE
TB1-NIL: 0 [IU]/mL
TB2-NIL: 0 [IU]/mL

## 2024-08-10 LAB — T4, FREE: Free T4: 1.1 ng/dL (ref 0.8–1.8)

## 2024-08-10 LAB — HEMOGLOBIN A1C
Hgb A1c MFr Bld: 6.2 % — ABNORMAL HIGH (ref <5.7)
Mean Plasma Glucose: 131 mg/dL
eAG (mmol/L): 7.3 mmol/L

## 2024-08-10 LAB — LIPID PANEL
Cholesterol: 92 mg/dL (ref <200)
HDL: 37 mg/dL — ABNORMAL LOW (ref >=40)
LDL Cholesterol (Calc): 31 mg/dL
Non-HDL Cholesterol (Calc): 55 mg/dL (ref <130)
Total CHOL/HDL Ratio: 2.5 (calc) (ref <5.0)
Triglycerides: 159 mg/dL — ABNORMAL HIGH (ref <150)

## 2024-08-10 LAB — PSA: PSA: 1.66 ng/mL (ref <=4.00)

## 2024-08-10 LAB — VITAMIN B12: Vitamin B-12: 276 pg/mL (ref 200–1100)

## 2024-08-15 ENCOUNTER — Other Ambulatory Visit: Payer: Self-pay | Admitting: Family Medicine

## 2024-08-15 ENCOUNTER — Encounter: Payer: Self-pay | Admitting: Family Medicine

## 2024-08-15 ENCOUNTER — Ambulatory Visit (INDEPENDENT_AMBULATORY_CARE_PROVIDER_SITE_OTHER): Payer: Self-pay | Admitting: Family Medicine

## 2024-08-15 VITALS — BP 120/70 | HR 64 | Ht 70.25 in | Wt 202.4 lb

## 2024-08-15 DIAGNOSIS — Z23 Encounter for immunization: Secondary | ICD-10-CM | POA: Diagnosis not present

## 2024-08-15 DIAGNOSIS — E538 Deficiency of other specified B group vitamins: Secondary | ICD-10-CM | POA: Diagnosis not present

## 2024-08-15 DIAGNOSIS — Z0000 Encounter for general adult medical examination without abnormal findings: Secondary | ICD-10-CM

## 2024-08-15 DIAGNOSIS — N182 Chronic kidney disease, stage 2 (mild): Secondary | ICD-10-CM

## 2024-08-15 DIAGNOSIS — E782 Mixed hyperlipidemia: Secondary | ICD-10-CM

## 2024-08-15 DIAGNOSIS — I252 Old myocardial infarction: Secondary | ICD-10-CM | POA: Diagnosis not present

## 2024-08-15 DIAGNOSIS — I129 Hypertensive chronic kidney disease with stage 1 through stage 4 chronic kidney disease, or unspecified chronic kidney disease: Secondary | ICD-10-CM | POA: Diagnosis not present

## 2024-08-15 DIAGNOSIS — N1831 Chronic kidney disease, stage 3a: Secondary | ICD-10-CM | POA: Diagnosis not present

## 2024-08-15 DIAGNOSIS — R7989 Other specified abnormal findings of blood chemistry: Secondary | ICD-10-CM

## 2024-08-15 DIAGNOSIS — R7309 Other abnormal glucose: Secondary | ICD-10-CM

## 2024-08-15 DIAGNOSIS — I2510 Atherosclerotic heart disease of native coronary artery without angina pectoris: Secondary | ICD-10-CM

## 2024-08-15 DIAGNOSIS — R351 Nocturia: Secondary | ICD-10-CM

## 2024-08-15 MED ORDER — LOSARTAN POTASSIUM 25 MG PO TABS: 25.0000 mg | ORAL_TABLET | Freq: Every day | ORAL | 3 refills | Status: AC

## 2024-08-15 MED ORDER — ATORVASTATIN CALCIUM 40 MG PO TABS: 40.0000 mg | ORAL_TABLET | Freq: Every day | ORAL | 3 refills | Status: AC
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
## 2024-08-15 NOTE — Patient Instructions (Addendum)
 Thank you for coming to the office today.  We will lab results to Dr Cathlyn TB Test was negative  B12 mildly low in 276, I would recommend the vitamin pill B12 supplement daily, 1000 mcg If needed we could reconsider the shots.  Flu Shot today  Consider Prevnar-20 pneumonia  Recent Labs    08/08/24 0819  HGBA1C 6.2*   Keep improving diet, limiting sweets. Great job.  Refilled Losartan  and Atorvastatin .  Cholesterol looks great, continue on the current treatment  DUE for FASTING BLOOD WORK (no food or drink after midnight before the lab appointment, only water or coffee without cream/sugar on the morning of)  SCHEDULE Lab Only visit in the morning at the clinic for lab draw in 1 YEAR  - Make sure Lab Only appointment is at about 1 week before your next appointment, so that results will be available  For Lab Results, once available within 2-3 days of blood draw, you can can log in to MyChart online to view your results and a brief explanation. Also, we can discuss results at next follow-up visit.   Please schedule a Follow-up Appointment to: Return for 1 year fasting lab > 1 week later Annual Physical.  If you have any other questions or concerns, please feel free to call the office or send a message through MyChart. You may also schedule an earlier appointment if necessary.  Additionally, you may be receiving a survey about your experience at our office within a few days to 1 week by e-mail or mail. We value your feedback.  Marsa Officer, DO [facility] [place], [place]

## 2025-08-08 ENCOUNTER — Other Ambulatory Visit

## 2025-08-15 ENCOUNTER — Encounter: Admitting: Family Medicine
